# Patient Record
Sex: Male | Born: 1985 | Race: White | Hispanic: No | Marital: Single | Smoking: Former smoker
Health system: Southern US, Community
[De-identification: ages and names within clinical notes are randomized; demographics above are authoritative.]

## PROBLEM LIST (undated history)

## (undated) DIAGNOSIS — F32A Depression, unspecified: Secondary | ICD-10-CM

## (undated) DIAGNOSIS — F419 Anxiety disorder, unspecified: Secondary | ICD-10-CM

## (undated) DIAGNOSIS — K649 Unspecified hemorrhoids: Secondary | ICD-10-CM

## (undated) DIAGNOSIS — F329 Major depressive disorder, single episode, unspecified: Secondary | ICD-10-CM

## (undated) HISTORY — DX: Unspecified hemorrhoids: K64.9

## (undated) HISTORY — PX: ORTHOPEDIC SURGERY: SHX850

---

## 2009-02-06 ENCOUNTER — Emergency Department: Payer: Self-pay | Admitting: Emergency Medicine

## 2010-10-11 ENCOUNTER — Inpatient Hospital Stay: Payer: Self-pay | Admitting: Psychiatry

## 2012-02-02 ENCOUNTER — Inpatient Hospital Stay: Payer: Self-pay | Admitting: Psychiatry

## 2012-02-02 LAB — COMPREHENSIVE METABOLIC PANEL
Albumin: 4.9 g/dL (ref 3.4–5.0)
Alkaline Phosphatase: 112 U/L (ref 50–136)
Bilirubin,Total: 1.8 mg/dL — ABNORMAL HIGH (ref 0.2–1.0)
Calcium, Total: 9.5 mg/dL (ref 8.5–10.1)
Co2: 30 mmol/L (ref 21–32)
Creatinine: 1.03 mg/dL (ref 0.60–1.30)
EGFR (Non-African Amer.): >60
Glucose: 92 mg/dL (ref 65–99)
Osmolality: 268 (ref 275–301)
Potassium: 4.2 mmol/L (ref 3.5–5.1)
Sodium: 134 mmol/L — ABNORMAL LOW (ref 136–145)

## 2012-02-02 LAB — CBC
MCH: 31.1 pg (ref 26.0–34.0)
MCHC: 33.6 g/dL (ref 32.0–36.0)
Platelet: 274 10*3/uL (ref 150–440)
RBC: 4.97 10*6/uL (ref 4.40–5.90)
RDW: 13 % (ref 11.5–14.5)

## 2012-02-02 LAB — DRUG SCREEN, URINE
Amphetamines, Ur Screen: NEGATIVE (ref ?–1000)
Cannabinoid 50 Ng, Ur ~~LOC~~: NEGATIVE (ref ?–50)
MDMA (Ecstasy)Ur Screen: NEGATIVE (ref ?–500)
Methadone, Ur Screen: NEGATIVE (ref ?–300)
Opiate, Ur Screen: NEGATIVE (ref ?–300)
Phencyclidine (PCP) Ur S: NEGATIVE (ref ?–25)
Tricyclic, Ur Screen: NEGATIVE (ref ?–1000)

## 2012-02-02 LAB — TSH: Thyroid Stimulating Horm: 0.78 u[IU]/mL

## 2012-02-02 LAB — ETHANOL: Ethanol: <3 mg/dL

## 2014-04-28 ENCOUNTER — Emergency Department: Payer: Self-pay | Admitting: Emergency Medicine

## 2014-04-28 LAB — CBC
HCT: 44 % (ref 40.0–52.0)
HGB: 14.6 g/dL (ref 13.0–18.0)
MCH: 30.1 pg (ref 26.0–34.0)
MCHC: 33.2 g/dL (ref 32.0–36.0)
MCV: 91 fL (ref 80–100)
Platelet: 317 10*3/uL (ref 150–440)
RBC: 4.84 10*6/uL (ref 4.40–5.90)
RDW: 13 % (ref 11.5–14.5)
WBC: 9.2 10*3/uL (ref 3.8–10.6)

## 2014-04-28 LAB — ETHANOL: Ethanol: 213 mg/dL

## 2014-04-28 LAB — COMPREHENSIVE METABOLIC PANEL
ALBUMIN: 4.2 g/dL (ref 3.4–5.0)
ALK PHOS: 76 U/L
ANION GAP: 7 (ref 7–16)
AST: 32 U/L (ref 15–37)
BUN: 8 mg/dL (ref 7–18)
Bilirubin,Total: 0.8 mg/dL (ref 0.2–1.0)
CO2: 29 mmol/L (ref 21–32)
CREATININE: 0.83 mg/dL (ref 0.60–1.30)
Calcium, Total: 8.3 mg/dL — ABNORMAL LOW (ref 8.5–10.1)
Chloride: 107 mmol/L (ref 98–107)
EGFR (African American): >60
EGFR (Non-African Amer.): >60
GLUCOSE: 113 mg/dL — AB (ref 65–99)
OSMOLALITY: 284 (ref 275–301)
POTASSIUM: 3.8 mmol/L (ref 3.5–5.1)
SGPT (ALT): 39 U/L
SODIUM: 143 mmol/L (ref 136–145)
TOTAL PROTEIN: 7.5 g/dL (ref 6.4–8.2)

## 2014-04-28 LAB — SALICYLATE LEVEL: Salicylates, Serum: <1.7 mg/dL

## 2014-04-28 LAB — ACETAMINOPHEN LEVEL

## 2014-04-29 LAB — URINALYSIS, COMPLETE
BILIRUBIN, UR: NEGATIVE
BLOOD: NEGATIVE
Bacteria: NONE SEEN
Glucose,UR: NEGATIVE mg/dL (ref 0–75)
KETONE: NEGATIVE
LEUKOCYTE ESTERASE: NEGATIVE
Nitrite: NEGATIVE
PH: 7 (ref 4.5–8.0)
Protein: NEGATIVE
RBC,UR: NONE SEEN /HPF (ref 0–5)
SQUAMOUS EPITHELIAL: NONE SEEN
Specific Gravity: 1.002 (ref 1.003–1.030)
WBC UR: NONE SEEN /HPF (ref 0–5)

## 2014-04-29 LAB — DRUG SCREEN, URINE
AMPHETAMINES, UR SCREEN: NEGATIVE (ref ?–1000)
BARBITURATES, UR SCREEN: NEGATIVE (ref ?–200)
Benzodiazepine, Ur Scrn: NEGATIVE (ref ?–200)
Cannabinoid 50 Ng, Ur ~~LOC~~: NEGATIVE (ref ?–50)
Cocaine Metabolite,Ur ~~LOC~~: NEGATIVE (ref ?–300)
MDMA (ECSTASY) UR SCREEN: NEGATIVE (ref ?–500)
METHADONE, UR SCREEN: NEGATIVE (ref ?–300)
Opiate, Ur Screen: NEGATIVE (ref ?–300)
Phencyclidine (PCP) Ur S: NEGATIVE (ref ?–25)
TRICYCLIC, UR SCREEN: NEGATIVE (ref ?–1000)

## 2014-05-10 ENCOUNTER — Ambulatory Visit: Payer: Self-pay | Admitting: Orthopedic Surgery

## 2014-05-10 LAB — DRUG SCREEN, URINE
AMPHETAMINES, UR SCREEN: NEGATIVE (ref ?–1000)
Barbiturates, Ur Screen: NEGATIVE (ref ?–200)
Benzodiazepine, Ur Scrn: NEGATIVE (ref ?–200)
COCAINE METABOLITE, UR ~~LOC~~: NEGATIVE (ref ?–300)
Cannabinoid 50 Ng, Ur ~~LOC~~: NEGATIVE (ref ?–50)
MDMA (ECSTASY) UR SCREEN: NEGATIVE (ref ?–500)
Methadone, Ur Screen: NEGATIVE (ref ?–300)
Opiate, Ur Screen: NEGATIVE (ref ?–300)
PHENCYCLIDINE (PCP) UR S: NEGATIVE (ref ?–25)
Tricyclic, Ur Screen: NEGATIVE (ref ?–1000)

## 2014-07-04 ENCOUNTER — Emergency Department: Payer: Self-pay | Admitting: Emergency Medicine

## 2014-07-04 LAB — URINALYSIS, COMPLETE
BACTERIA: NONE SEEN
Bilirubin,UR: NEGATIVE
Glucose,UR: NEGATIVE mg/dL (ref 0–75)
Ketone: NEGATIVE
LEUKOCYTE ESTERASE: NEGATIVE
NITRITE: NEGATIVE
PROTEIN: NEGATIVE
Ph: 7 (ref 4.5–8.0)
SQUAMOUS EPITHELIAL: NONE SEEN
Specific Gravity: 1.002 (ref 1.003–1.030)
WBC UR: NONE SEEN /HPF (ref 0–5)

## 2014-07-04 LAB — CBC
HCT: 44.7 % (ref 40.0–52.0)
HGB: 14.5 g/dL (ref 13.0–18.0)
MCH: 30.1 pg (ref 26.0–34.0)
MCHC: 32.5 g/dL (ref 32.0–36.0)
MCV: 93 fL (ref 80–100)
PLATELETS: 307 10*3/uL (ref 150–440)
RBC: 4.82 10*6/uL (ref 4.40–5.90)
RDW: 13.4 % (ref 11.5–14.5)
WBC: 7.6 10*3/uL (ref 3.8–10.6)

## 2014-07-04 LAB — COMPREHENSIVE METABOLIC PANEL
ALBUMIN: 4.5 g/dL (ref 3.4–5.0)
ALT: 45 U/L
ANION GAP: 7 (ref 7–16)
Alkaline Phosphatase: 93 U/L
BUN: 8 mg/dL (ref 7–18)
Bilirubin,Total: 1 mg/dL (ref 0.2–1.0)
CALCIUM: 9 mg/dL (ref 8.5–10.1)
Chloride: 107 mmol/L (ref 98–107)
Co2: 28 mmol/L (ref 21–32)
Creatinine: 1.03 mg/dL (ref 0.60–1.30)
EGFR (Non-African Amer.): >60
Glucose: 104 mg/dL — ABNORMAL HIGH (ref 65–99)
Osmolality: 282 (ref 275–301)
POTASSIUM: 4 mmol/L (ref 3.5–5.1)
SGOT(AST): 28 U/L (ref 15–37)
SODIUM: 142 mmol/L (ref 136–145)
Total Protein: 7.8 g/dL (ref 6.4–8.2)

## 2014-07-04 LAB — DRUG SCREEN, URINE
AMPHETAMINES, UR SCREEN: NEGATIVE (ref ?–1000)
Barbiturates, Ur Screen: NEGATIVE (ref ?–200)
Benzodiazepine, Ur Scrn: NEGATIVE (ref ?–200)
CANNABINOID 50 NG, UR ~~LOC~~: NEGATIVE (ref ?–50)
COCAINE METABOLITE, UR ~~LOC~~: NEGATIVE (ref ?–300)
MDMA (Ecstasy)Ur Screen: NEGATIVE (ref ?–500)
Methadone, Ur Screen: NEGATIVE (ref ?–300)
Opiate, Ur Screen: POSITIVE (ref ?–300)
PHENCYCLIDINE (PCP) UR S: NEGATIVE (ref ?–25)
Tricyclic, Ur Screen: NEGATIVE (ref ?–1000)

## 2014-07-04 LAB — SALICYLATE LEVEL: Salicylates, Serum: <1.7 mg/dL

## 2014-07-04 LAB — ACETAMINOPHEN LEVEL

## 2014-07-04 LAB — ETHANOL: Ethanol: 211 mg/dL

## 2014-07-13 ENCOUNTER — Emergency Department (HOSPITAL_COMMUNITY): Payer: BC Managed Care – PPO

## 2014-07-13 ENCOUNTER — Emergency Department (HOSPITAL_COMMUNITY)
Admission: EM | Admit: 2014-07-13 | Discharge: 2014-07-14 | Disposition: A | Discharge status: Home / Self Care | Payer: BC Managed Care – PPO | Attending: Emergency Medicine | Admitting: Emergency Medicine

## 2014-07-13 ENCOUNTER — Encounter (HOSPITAL_COMMUNITY): Payer: Self-pay

## 2014-07-13 DIAGNOSIS — Z720 Tobacco use: Secondary | ICD-10-CM | POA: Insufficient documentation

## 2014-07-13 DIAGNOSIS — Y9389 Activity, other specified: Secondary | ICD-10-CM | POA: Diagnosis not present

## 2014-07-13 DIAGNOSIS — F1010 Alcohol abuse, uncomplicated: Secondary | ICD-10-CM | POA: Diagnosis present

## 2014-07-13 DIAGNOSIS — Z79899 Other long term (current) drug therapy: Secondary | ICD-10-CM | POA: Diagnosis not present

## 2014-07-13 DIAGNOSIS — F10220 Alcohol dependence with intoxication, uncomplicated: Secondary | ICD-10-CM

## 2014-07-13 DIAGNOSIS — F419 Anxiety disorder, unspecified: Secondary | ICD-10-CM | POA: Diagnosis not present

## 2014-07-13 DIAGNOSIS — Y998 Other external cause status: Secondary | ICD-10-CM | POA: Diagnosis not present

## 2014-07-13 DIAGNOSIS — F10229 Alcohol dependence with intoxication, unspecified: Secondary | ICD-10-CM | POA: Diagnosis not present

## 2014-07-13 DIAGNOSIS — F1110 Opioid abuse, uncomplicated: Secondary | ICD-10-CM | POA: Diagnosis not present

## 2014-07-13 DIAGNOSIS — S63501A Unspecified sprain of right wrist, initial encounter: Secondary | ICD-10-CM | POA: Diagnosis not present

## 2014-07-13 DIAGNOSIS — Y92410 Unspecified street and highway as the place of occurrence of the external cause: Secondary | ICD-10-CM | POA: Diagnosis not present

## 2014-07-13 DIAGNOSIS — Z880 Allergy status to penicillin: Secondary | ICD-10-CM | POA: Diagnosis not present

## 2014-07-13 DIAGNOSIS — R52 Pain, unspecified: Secondary | ICD-10-CM

## 2014-07-13 DIAGNOSIS — F329 Major depressive disorder, single episode, unspecified: Secondary | ICD-10-CM | POA: Insufficient documentation

## 2014-07-13 HISTORY — DX: Major depressive disorder, single episode, unspecified: F32.9

## 2014-07-13 HISTORY — DX: Depression, unspecified: F32.A

## 2014-07-13 HISTORY — DX: Anxiety disorder, unspecified: F41.9

## 2014-07-13 LAB — CBC WITH DIFFERENTIAL/PLATELET
Basophils Absolute: 0.1 10*3/uL (ref 0.0–0.1)
Basophils Relative: 1 % (ref 0–1)
EOS PCT: 0 % (ref 0–5)
Eosinophils Absolute: 0 10*3/uL (ref 0.0–0.7)
HCT: 46.2 % (ref 39.0–52.0)
Hemoglobin: 15.3 g/dL (ref 13.0–17.0)
LYMPHS ABS: 3.3 10*3/uL (ref 0.7–4.0)
Lymphocytes Relative: 36 % (ref 12–46)
MCH: 30.6 pg (ref 26.0–34.0)
MCHC: 33.1 g/dL (ref 30.0–36.0)
MCV: 92.4 fL (ref 78.0–100.0)
Monocytes Absolute: 0.8 10*3/uL (ref 0.1–1.0)
Monocytes Relative: 9 % (ref 3–12)
Neutro Abs: 5 10*3/uL (ref 1.7–7.7)
Neutrophils Relative %: 54 % (ref 43–77)
PLATELETS: 303 10*3/uL (ref 150–400)
RBC: 5 MIL/uL (ref 4.22–5.81)
RDW: 14 % (ref 11.5–15.5)
WBC: 9.3 10*3/uL (ref 4.0–10.5)
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
## 2014-07-14 ENCOUNTER — Encounter (HOSPITAL_COMMUNITY): Payer: Self-pay | Admitting: *Deleted

## 2014-07-14 ENCOUNTER — Emergency Department: Payer: Self-pay | Admitting: Emergency Medicine

## 2014-07-14 LAB — COMPREHENSIVE METABOLIC PANEL
Albumin: 4.1 g/dL (ref 3.4–5.0)
Alkaline Phosphatase: 104 U/L
Anion Gap: 7 (ref 7–16)
BUN: 8 mg/dL (ref 7–18)
Bilirubin,Total: 1.1 mg/dL — ABNORMAL HIGH (ref 0.2–1.0)
CHLORIDE: 101 mmol/L (ref 98–107)
CO2: 31 mmol/L (ref 21–32)
CREATININE: 0.95 mg/dL (ref 0.60–1.30)
Calcium, Total: 8.8 mg/dL (ref 8.5–10.1)
EGFR (African American): >60
EGFR (Non-African Amer.): >60
Glucose: 98 mg/dL (ref 65–99)
Osmolality: 276 (ref 275–301)
Potassium: 3.7 mmol/L (ref 3.5–5.1)
SGOT(AST): 29 U/L (ref 15–37)
SGPT (ALT): 38 U/L
SODIUM: 139 mmol/L (ref 136–145)
TOTAL PROTEIN: 7.4 g/dL (ref 6.4–8.2)

## 2014-07-14 LAB — DRUG SCREEN, URINE
Amphetamines, Ur Screen: NEGATIVE (ref ?–1000)
BARBITURATES, UR SCREEN: NEGATIVE (ref ?–200)
Benzodiazepine, Ur Scrn: POSITIVE (ref ?–200)
CANNABINOID 50 NG, UR ~~LOC~~: NEGATIVE (ref ?–50)
COCAINE METABOLITE, UR ~~LOC~~: NEGATIVE (ref ?–300)
MDMA (Ecstasy)Ur Screen: NEGATIVE (ref ?–500)
Methadone, Ur Screen: NEGATIVE (ref ?–300)
OPIATE, UR SCREEN: NEGATIVE (ref ?–300)
PHENCYCLIDINE (PCP) UR S: NEGATIVE (ref ?–25)
Tricyclic, Ur Screen: NEGATIVE (ref ?–1000)

## 2014-07-14 LAB — BASIC METABOLIC PANEL
Anion gap: 14 (ref 5–15)
BUN: 6 mg/dL (ref 6–23)
CALCIUM: 8.9 mg/dL (ref 8.4–10.5)
CO2: 29 mEq/L (ref 19–32)
Chloride: 104 mEq/L (ref 96–112)
Creatinine, Ser: 0.99 mg/dL (ref 0.50–1.35)
GFR calc Af Amer: >90 mL/min (ref >90)
Glucose, Bld: 114 mg/dL — ABNORMAL HIGH (ref 70–99)
Potassium: 4.4 mEq/L (ref 3.7–5.3)
Sodium: 147 mEq/L (ref 137–147)

## 2014-07-14 LAB — RAPID URINE DRUG SCREEN, HOSP PERFORMED
AMPHETAMINES: NOT DETECTED
BENZODIAZEPINES: NOT DETECTED
Barbiturates: NOT DETECTED
Cocaine: NOT DETECTED
OPIATES: NOT DETECTED
Tetrahydrocannabinol: NOT DETECTED

## 2014-07-14 LAB — URINALYSIS, COMPLETE
BILIRUBIN, UR: NEGATIVE
Bacteria: NONE SEEN
Blood: NEGATIVE
Glucose,UR: NEGATIVE mg/dL (ref 0–75)
Ketone: NEGATIVE
Leukocyte Esterase: NEGATIVE
Nitrite: NEGATIVE
Ph: 6 (ref 4.5–8.0)
Protein: 30
RBC,UR: 2 /HPF (ref 0–5)
Specific Gravity: 1.02 (ref 1.003–1.030)
Squamous Epithelial: <1
WBC UR: 1 /HPF (ref 0–5)

## 2014-07-14 LAB — CBC
HCT: 44.6 % (ref 40.0–52.0)
HGB: 14.5 g/dL (ref 13.0–18.0)
MCH: 30.3 pg (ref 26.0–34.0)
MCHC: 32.6 g/dL (ref 32.0–36.0)
MCV: 93 fL (ref 80–100)
Platelet: 283 10*3/uL (ref 150–440)
RBC: 4.8 10*6/uL (ref 4.40–5.90)
RDW: 14.4 % (ref 11.5–14.5)
WBC: 9.5 10*3/uL (ref 3.8–10.6)

## 2014-07-14 LAB — ETHANOL
ALCOHOL ETHYL (B): 249 mg/dL — AB (ref 0–11)
ALCOHOL ETHYL (B): 331 mg/dL — AB (ref 0–11)
ETHANOL LVL: 23 mg/dL

## 2014-07-14 LAB — ACETAMINOPHEN LEVEL: Acetaminophen: <2 ug/mL

## 2014-07-14 MED ORDER — METHOCARBAMOL 500 MG PO TABS: 500.0000 mg | ORAL_TABLET | Freq: Three times a day (TID) | ORAL | Status: DC | PRN

## 2014-07-14 MED ORDER — NAPROXEN 250 MG PO TABS: 500.0000 mg | ORAL_TABLET | Freq: Two times a day (BID) | ORAL | Status: DC | PRN

## 2014-07-14 MED ORDER — LORAZEPAM 1 MG PO TABS: 0.0000 mg | ORAL_TABLET | Freq: Two times a day (BID) | ORAL | Status: DC

## 2014-07-14 MED ORDER — VITAMIN B-1 100 MG PO TABS: 100.0000 mg | ORAL_TABLET | Freq: Every day | ORAL | Status: DC

## 2014-07-14 MED ORDER — THIAMINE HCL 100 MG/ML IJ SOLN: 100.0000 mg | Freq: Every day | INTRAMUSCULAR | Status: DC

## 2014-07-14 MED ORDER — HYDROXYZINE HCL 25 MG PO TABS
25.0000 mg | ORAL_TABLET | Freq: Four times a day (QID) | ORAL | Status: DC | PRN
  Administered 2014-07-14: 25 mg via ORAL
  Filled 2014-07-14: qty 1

## 2014-07-14 MED ORDER — DICYCLOMINE HCL 20 MG PO TABS
20.0000 mg | ORAL_TABLET | Freq: Four times a day (QID) | ORAL | Status: DC | PRN
  Filled 2014-07-14: qty 1

## 2014-07-14 MED ORDER — ONDANSETRON 4 MG PO TBDP
4.0000 mg | ORAL_TABLET | Freq: Four times a day (QID) | ORAL | Status: DC | PRN
  Administered 2014-07-14: 4 mg via ORAL
  Filled 2014-07-14: qty 1

## 2014-07-14 MED ORDER — CLONIDINE HCL 0.1 MG PO TABS
0.1000 mg | ORAL_TABLET | Freq: Four times a day (QID) | ORAL | Status: DC
  Administered 2014-07-14: 0.1 mg via ORAL
  Filled 2014-07-14: qty 1

## 2014-07-14 MED ORDER — LORAZEPAM 1 MG PO TABS: 0.0000 mg | ORAL_TABLET | Freq: Four times a day (QID) | ORAL | Status: DC

## 2014-07-14 MED ORDER — LOPERAMIDE HCL 2 MG PO CAPS: 2.0000 mg | ORAL_CAPSULE | ORAL | Status: DC | PRN

## 2014-07-14 MED ORDER — CLONIDINE HCL 0.1 MG PO TABS: 0.1000 mg | ORAL_TABLET | Freq: Every day | ORAL | Status: DC

## 2014-07-14 MED ORDER — CLONIDINE HCL 0.1 MG PO TABS: 0.1000 mg | ORAL_TABLET | ORAL | Status: DC
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
## 2014-07-14 NOTE — ED Notes (Signed)
Terri from behavorial health is conducting pt's tele pysch assessment.

## 2014-07-14 NOTE — ED Notes (Signed)
Pt is discharge with outpatient resources for substance abuse, pt refused splint for right wrist.  Pt was informed he could wait in waiting area until mother can come from [place], [place] to pick him up.

## 2014-07-14 NOTE — ED Notes (Signed)
Terri from behavorial health recommendation is for outpatient resources and discharge pt, pt will be given referrals once paperwork faxed from behavorial health, pt informed of discharge and that he will need to call someone for a ride.

## 2014-07-14 NOTE — ED Notes (Signed)
Spoke to pt's mother Render Marley, she said it would take her about an hour to pick up pt, and for him to wait in the lobby until they got here. Mother Kolyn Rozario [phone].

## 2014-11-19 NOTE — Consult Note (Signed)
PATIENT NAME:  Darryl Crawford, VANZANTEN MR#:  [id number] DATE OF BIRTH:  11-06-1985  DATE OF CONSULTATION:  07/15/2014  REFERRING PHYSICIAN:   CONSULTING PHYSICIAN:  Gonzella Lex, MD  IDENTIFYING INFORMATION AND REASON FOR CONSULTATION: A 29 year old man came voluntarily to the Emergency Room.   CHIEF COMPLAINT: "I'm detoxing from alcohol."   HISTORY OF PRESENT ILLNESS: Information obtained from the patient and the chart. The patient states that for about the last week he has been drinking nearly a fifth of liquor a day. He also was continuing to use narcotic pain medicine but it has been about 3 days since he had any. He was using Xanax to try and control some of his withdrawal symptoms in the meantime. On evaluation today, the patient says he is sore all over, muscles are aching, feels sick to his stomach, has had trouble being able to eat or drink anything this morning. He is not reporting any hallucinations. Does not appear to be delirious. He denies any suicidal or homicidal ideation. He had followed up at [facility] after his last visit here on December 8 but then relapsed into use again.   PAST PSYCHIATRIC HISTORY: Mr. Mozer has a long history of substance abuse. Has been to rehabilitation programs in the past. He has a history of often presenting agitated with unstable mood while he is intoxicated, but there is no evidence that he has had a mood or psychiatric disorder when he is not actively intoxicated. Not currently on any psychiatric medicine. No history of suicide attempts.   PAST MEDICAL HISTORY: No known medical problems. It was reported on the intake that he was described as a patient with a seizure disorder. He does not have any known history of a seizure disorder. He may have had an alcohol withdrawal seizure at one point, but not in the times we have been seeing him.   FAMILY HISTORY: Has a brother with bipolar disorder and alcohol abuse and depression in other members of the family.   SOCIAL  HISTORY: He has been living with his parents. He does have a job. Says he has been able to work some this past week. Has children, but has not been able to really do much to take care of them since he has been actively using.   CURRENT MEDICATIONS: None that I know of for sure.   ALLERGIES: No known drug allergies.   REVIEW OF SYSTEMS: Feeling sick to his stomach, has had loose bowel movements. Feels shaky and has pain all over. Denies suicidal or homicidal ideation. Denies any hallucinations. Mood is dysphoric, but not completely hopeless.   MENTAL STATUS EXAMINATION: Slightly disheveled gentleman who is cooperative with the interview. Eye contact intermittent. Psychomotor activity a little sluggish. Mood is quiet, but easy to understand. Affect is dysphoric and a little anxious. Mood is stated as depressed. Thoughts are lucid without loosening of associations or delusions. Denies auditory or visual hallucinations. Denies suicidal or homicidal ideation. He is alert and oriented x 4. Can repeat 3/3 objects immediately, remembers 2/3 at 3 minutes. Judgment and insight intact. Intelligence normal.   LABORATORY RESULTS: Drug screen positive only for benzodiazepines. Urinalysis unremarkable. Acetaminophen negative. CBC negative. Chemistry panel: Elevated bilirubin 1.1, nothing else remarkable.   VITAL SIGNS: Blood pressure 170/90, respirations 22, pulse 104, temperature 98.4.   ASSESSMENT: A 29 year old man with a history of alcohol dependence presented back to the hospital no longer intoxicated, but with alcohol withdrawal. Also having active symptoms of opiate withdrawal, even  though he has not used for a couple of days. The patient is calm right now and not delirious. Denies suicidal ideation. He is knowledgeable about appropriate outpatient treatment, and has established himself as a client at [facility]. He at this point does not require further inpatient hospitalization.   TREATMENT PLAN: The patient  requested that we give him a single dose of Suboxone prior to discharge for his ongoing opiate withdrawal. I think that is reasonable and we will give him 8 mg Subutex now before he leaves and a dose of Zofran as well. The patient will be going home to stay with his parents and will follow up with [facility] and we will see him immediately on Monday. If delirium develops or physically he is getting worse, he knows he can return to the Emergency Room.   DIAGNOSIS, PRINCIPAL AND PRIMARY:  AXIS I: Alcohol abuse, severe.   SECONDARY DIAGNOSES:  AXIS I:  1.  Opiate abuse.  2.  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  AXIS II: Deferred.  AXIS III: Opiate withdrawal.    ____________________________ Gonzella Lex, MD jtc:at D: 07/15/2014 14:10:01 ET T: 07/15/2014 14:41:00 ET JOB#: [id number]  cc: Gonzella Lex, MD, <Dictator> Gonzella Lex MD ELECTRONICALLY SIGNED 07/20/2014 0:40

## 2014-11-19 NOTE — Consult Note (Signed)
PATIENT NAME:  Darryl Crawford, Darryl Crawford MR#:  [id number] DATE OF BIRTH:  04-30-1986  DATE OF CONSULTATION:  04/29/2014  CONSULTING PHYSICIAN:  Gonzella Lex, MD  IDENTIFYING INFORMATION AND REASON FOR CONSULTATION: This is a 29 year old man who presented to the Emergency Room under involuntary commitment with report that he had been intoxicated, acting bizarre, talking about killing himself.   CHIEF COMPLAINT: "I relapsed."   HISTORY OF PRESENT ILLNESS: Commitment paperwork and report from the family as the patient was lying on the floor of his house at home with a knife to his neck talking about how he was going to kill himself with a gun, drink bleach, or stab himself in the neck. Family reported that he had been pacing, and agitated and talking about suicide. The patient says that he relapsed about 3 days ago and has been drinking a large amount of beer ever since then. Has not been using any other drugs. He has been feeling very bad about himself, because of his relapse, called his sponsor to try and get some help stopping, but was still not able to halt his relapse. He is not currently getting any outpatient psychiatric medicine. He says he has been under a lot of stress recently which may have been a cause for his relapse with a lot of work stress.   PAST PSYCHIATRIC HISTORY: History of alcohol dependence. Has been to [facility] in the past, has been able to maintain sobriety for almost a year at a time. Had been sober for probably about 8 months before this relapse 3 days ago. He denies that he has ever seriously tried to kill himself in the past. There does seem to be a history of hallucinations, especially when intoxicated and agitated, suicidal-like behavior when he is intoxicated.   The patient used to see a psychiatrist and was on antidepressant medicine but he said it made him feel too sedated so he stopped taking it about a year ago.   PAST MEDICAL HISTORY: He punched a post with his right hand a day  or so ago. Other than that has no significant ongoing medical problems.   SOCIAL HISTORY: Lives with his parents. Has 2 children who do not live with him full time, but he takes care of them part time in a shared custody. 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   SUBSTANCE ABUSE HISTORY: Long history of alcohol abuse. Has had alcohol withdrawal seizures, possibly DTs in the past. Does get hallucinatory and agitated when he is intoxicated.   FAMILY HISTORY: Mother with depression, brother with depression.   CURRENT MEDICATIONS: None.   ALLERGIES: No known drug allergies.   REVIEW OF SYSTEMS: Acute pain in his hand, tired, run down, mildly depressed. Denies suicidal ideation. Denies homicidal ideation. Denies current hallucinations. The rest of the full 9-category review of systems negative.   MENTAL STATUS EXAMINATION: Adequately groomed man who looks in reasonable health other than the obvious injury to his right hand. Cooperative with the exam. Eye contact decreased. Psychomotor activity slow. Speech slow and quiet. Affect flat. Mood stated as depressed. Thoughts are lucid but slow. No obvious loosening of associations or delusions. Denies auditory or visual hallucinations. Denies current suicidal or homicidal ideation. Could recall 3/3 objects immediately and at 3 minutes. Long-term memory intact. Judgment and insight currently adequate. Normal fund of knowledge. Normal intelligence.   LABORATORY RESULTS: Right hand x-ray shows a fifth metacarpal fracture, salicylates and acetaminophen negative.  Alcohol level is 213. Chemistry panel: Low calcium 8.3. CBC normal. Urinalysis normal. Drug screen all negative.   VITAL SIGNS: Blood pressure 127/74, respirations 18, pulse 96, temperature 98.1.   NEUROLOGIC: The patient walk, move all extremities, but his right hand and wrist are obviously swollen and tender.   ASSESSMENT: A 29 year old man with alcohol  dependence, substance-induced depression rule out major depression, fractured hand, needs hospitalization because of history of seizures and recent suicidality.   TREATMENT PLAN: Admit to psychiatry. CIWA protocol in place. Suicide, close and seizure precautions in place. I am going to get an orthopedic consult and discuss this with the Emergency Room doctor. We will get him some pain medicine and try and get his hand dealt with.   DIAGNOSIS, PRINCIPAL AND PRIMARY:   AXIS I: Substance-induced mood disorder, depressed, from alcohol.   SECONDARY DIAGNOSES:  AXIS I: Alcohol abuse.   AXIS II: Deferred.   AXIS III: Fracture of the right-hand, acute.    ____________________________ Gonzella Lex, MD jtc:lt D: 04/29/2014 15:44:14 ET T: 04/29/2014 15:53:30 ET JOB#: [id number]  cc: Gonzella Lex, MD, <Dictator> Gonzella Lex MD ELECTRONICALLY SIGNED 05/05/2014 16:08

## 2014-11-19 NOTE — Consult Note (Signed)
PATIENT NAME:  Darryl Crawford, RITSON MR#:  [id number] DATE OF BIRTH:  1986/05/19  DATE OF CONSULTATION:  07/04/2014  CONSULTING PHYSICIAN:  Gonzella Lex, MD  IDENTIFYING INFORMATION AND REASON FOR CONSULTATION: This is a 29 year old man with a history of alcohol abuse and mood instability who comes into the hospital requesting opiate detoxification.   CHIEF COMPLAINT: "I need detox."   HISTORY OF PRESENT ILLNESS: The patient states that since he was here last he had started up with abusing opiate pain medicine. It may have started because of his broken hand. In any case, he said he first started with taking Vicodin, but then escalated up to any kind of opiate he could get. He denies that he has been injecting drugs, but has been inhaling heroin and other drugs. He also states that he has been back to drinking, but he minimizes the degree of that. He says that he has not had anything to drink for a couple of days and that he feels like he has gotten over all of his alcohol withdrawal symptoms and his main concern now is opiate withdrawal symptoms. He complains of pain all over his body, shaking, feeling sick to his stomach. He denies any suicidal ideation or homicidal ideation or psychotic symptoms.   PAST PSYCHIATRIC HISTORY: The patient has a history of alcohol abuse with mood instability, agitation, and suicidal behavior, and hostility while he is intoxicated. He was here in early October and we tried to admit him to the hospital, but there was a hold up because of his broken arm. He does have a history of going to [facility] and has been able to stay sober for extended times. The opiate abuse he says is a new problem for him.   PAST MEDICAL HISTORY: Back in October he had punched a wall and broke his hand. That seems to have healed up now.   FAMILY HISTORY: Has a brother with bipolar disorder and alcoholism. Also history of depression in other members of his family.   CURRENT MEDICATIONS: None.   SOCIAL  HISTORY: Currently has been back living with his parents. He had been able to work intermittently recently.   REVIEW OF SYSTEMS: Nausea, diarrhea, pain all over his body, feeling fatigued, and sick. Some hopelessness, but denies suicidal ideation. Denies hallucinations.   MENTAL STATUS EXAMINATION: A somewhat disheveled gentleman who looks his stated age. Passively cooperative with the interview. Eye contact decreased. Psychomotor activity sluggish. Speech decreased in total amount and quiet. Affect flat. Mood stated as bad. Thoughts are slow but lucid. No evidence of delusions or loosening of associations. Denies auditory or visual hallucinations. Denies suicidal or homicidal ideation. Can remember 3/3 objects immediately, 2 out of 3 at 3 minutes. Alert and oriented x 4. Judgment and insight intact.   LABORATORY RESULTS: Alcohol level at 2:00 today was 200, which is puzzling given that he says that he had stopped drinking a few days ago. Chemistry panel unremarkable. CBC normal. Urinalysis positive for 1+ blood, otherwise negative. Drug screen positive for opiates.   VITAL SIGNS: Blood pressure is currently 144/99, respirations 20, pulse 120, temperature 98.   ASSESSMENT: A 29 year old man with a history of alcohol abuse and a history of depressed mood, presents now with his chief complaint being opiate abuse. Although he was intoxicated with a blood alcohol level of 200 when he came in here, he was not acting agitated or violent. He says that he actually stopped drinking a few days ago and feels like  he is past the worst of the alcohol withdrawal symptoms. He is not acutely suicidal. There is no clear indication for inpatient hospital treatment. The patient is clearly miserable from the opiate withdrawal.   TREATMENT PLAN: Stat order of Subutex 8 mg followed by another day and a half of Subutex 4 mg twice a day. Ondansetron p.r.n. for nausea. The patient is under involuntary commitment. We can look  into trying to refer him to [facility] as he does not require hospital level treatment necessarily.   DIAGNOSIS, PRINCIPAL AND PRIMARY:  AXIS I: Opiate abuse, moderate.   SECONDARY DIAGNOSES: AXIS I:  1.  Alcohol abuse, moderate.  2.  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   ____________________________ Gonzella Lex, MD jtc:LT D: 07/04/2014 17:06:06 ET T: 07/04/2014 18:21:34 ET JOB#: [id number]  cc: Gonzella Lex, MD, <Dictator> Gonzella Lex MD ELECTRONICALLY SIGNED 07/10/2014 11:15

## 2014-11-19 NOTE — H&P (Signed)
PATIENT NAME:  Darryl Crawford, Darryl Crawford MR#:  [id number] DATE OF BIRTH:  12/31/1985  DATE OF ADMISSION:  04/28/2014  REFERRING PHYSICIAN: Marjean Donna, MD  ATTENDING  PHYSICIAN:  Jolanta B. Bary Leriche, MD   IDENTIFYING DATA: Darryl Crawford is a 29 year old male with history of depression and alcoholism.   CHIEF COMPLAINT: "I was drunk."   HISTORY OF PRESENT ILLNESS: [name] has been drinking since the age of 53. In the past few months, however, maybe half a year, he has been sober. He has been staying with his parents. He takes care of his 51-1/2-year-old son. He goes to [employer], has a sponsor. 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 He was about to move out of his parents' house and lives independently with his son. Three days ago, he relapsed on alcohol. He is unable to find any reason except that maybe he was trying to torpedo his plan to move out. As a result, he became very depressed, upset and suicidal. Yesterday, he punched a post and fractured his hand. Later, he became agitated, pacing and not sleeping. He threatened to kill himself with a gun, had a knife beforehand and then was found on the floor by his mother with a knife. When the police arrived, he was still threatening to kill himself. He was brought to the emergency room. He feels very depressed with poor self-esteem, feelings of guilt, hopelessness, worthlessness, crying, social isolation and still passing suicidal thoughts. He now is upset not only about his relapse, but also about the fact that while drunk, he can be so unpredictable, dangerous to himself, frightening to his family and his son. He denies psychotic symptoms. There are no symptoms suggestive of bipolar mania. He denies, other than alcohol, substance use.   PAST PSYCHIATRIC HISTORY: He has been a drinker for a long time. He was hospitalized at [facility] at least twice in 2013 and 2012 in a similar scenario. In  the past, he has been treated for depression, but felt that antidepressants made him sleepy and unable to work. He has not been seeing a psychiatrist lately. He denies ever attempting suicide. He has a history of alcohol where there were seizures, but tells me that he has not had seizures for 6 years and no longer takes Dilantin.   FAMILY PSYCHIATRIC HISTORY: Brother with bipolar and alcoholism and depression his mother's side.   PAST MEDICAL HISTORY: Fracture of the right hand, comminuted fracture involving the base of the fifth metacarpal. He needs a cast.   ALLERGIES: No known drug allergies.   MEDICATIONS ON ADMISSION: Now none.   SOCIAL HISTORY: As above. He lives with his parents. He takes care of his 86-1/2-year-old son. He has never been married to the mother. He has a job on a farm, earns income. He does not have health insurance. He denies any legal charges.   REVIEW OF SYSTEMS:  CONSTITUTIONAL: No fevers or chills. No weight changes.  EYES: No double or blurred vision.  ENT: No hearing loss.  RESPIRATORY: No shortness of breath or cough.  CARDIOVASCULAR: No chest pain or orthopnea.  GASTROINTESTINAL: No abdominal pain, nausea, vomiting or diarrhea.  GENITOURINARY: No incontinence or frequency.  ENDOCRINE: No heat or cold intolerance.  LYMPHATIC: No anemia or easy bruising.  INTEGUMENTARY: No acne or rash.  MUSCULOSKELETAL: Fracture of right hand.  NEUROLOGIC: No tingling or weakness.  PSYCHIATRIC: See History of Present Illness for details.  PHYSICAL EXAMINATION: VITAL SIGNS: Blood pressure 127/74, pulse 96, respirations 18, temperature 98.1.  GENERAL: This is a well-developed, young male in no acute distress.  HEENT: The pupils are equal, round and reactive to light. Sclerae are anicteric.  NECK: Supple. No thyromegaly.  LUNGS: Clear to auscultation. No dullness to percussion.  HEART: Regular rhythm and rate. No murmurs, rubs or gallops.  ABDOMEN: Soft, nontender,  nondistended. Positive bowel sounds.  MUSCULOSKELETAL: Normal muscle strength in all extremities. Swelling of the right hand. There is no cast yet.  SKIN: No rashes or bruises.  LYMPHATIC: No cervical adenopathy.  NEUROLOGIC: Cranial nerves 2 through 12 are intact.   LABORATORY DATA: Chemistries are within normal limits. Blood alcohol level is 213. LFTs within normal limits. Urine toxicology screen negative for substances. CBC within normal limits. Urinalysis is not suggestive of urinary tract infection. Serum acetaminophen and salicylates are low.   MENTAL STATUS EXAMINATION ON ADMISSION: The patient is alert and oriented to person, place, time and situation. He is pleasant, polite and cooperative. He maintains good eye contact. He is well groomed, wearing hospital scrubs. His speech is of normal rhythm, rate and volume. Mood is ashamed with a flat affect. Thought process is logical and goal oriented. Thought content: He denies thoughts of hurting himself or others, but threatened suicide while drunk earlier. There are no delusions or paranoia. There are no auditory or visual hallucinations. His cognition is grossly intact. Registration, recall, short and long-term memory are intact. He is of average intelligence and fund of knowledge. His insight and judgment are fair.   SUICIDE RISK ASSESSMENT ON ADMISSION: This is a patient with a history of alcoholism, who relapsed on alcohol 3 days ago following an extended period of sobriety, became rather depressed, upset and suicidal about it.  INITIAL DIAGNOSES:   AXIS I:  1. Substance-induced depression.  2. Alcohol use disorder.   AXIS II: Deferred.   AXIS III: Fracture of the right hand.   PLAN: The patient was admitted to [facility] for safety, stabilization and medication management. He was initially placed on suicide precautions and was closely monitored for any unsafe behaviors. He underwent full  psychiatric and risk assessment. He received pharmacotherapy, individual and group psychotherapy, substance abuse counseling and support from therapeutic milieu.  1. Suicidal ideation: The patient is able to contract for safety.  2. Mood. He is not interested in starting an antidepressant. He has some bad experience from the past.  3. Alcohol. He relapsed 3 days ago. He was placed on a CIWA protocol.  4. Fracture. I was told that the patient will have a splint placed in the emergency room before coming to the unit. Dr. Weber Cooks started him on Percocet for pain.  5. Substance abuse treatment. He does not feel that he needs to go to rehab. He hopes to return home and to work as soon as possible.   DISPOSITION: Discharged to home.   FOLLOWUP: With probably [facility].     ____________________________ Wardell Honour Bary Leriche, MD jbp:TT D: 04/29/2014 18:21:27 ET T: 04/29/2014 19:09:52 ET JOB#: [id number]  cc: Jolanta B. Bary Leriche, MD, <Dictator> Clovis Fredrickson MD ELECTRONICALLY SIGNED 05/05/2014 0:54

## 2014-11-19 NOTE — Op Note (Signed)
PATIENT NAME:  Darryl Crawford, Darryl Crawford MR#:  [id number] DATE OF BIRTH:  1985-08-27  DATE OF PROCEDURE:  05/10/2014  PREOPERATIVE DIAGNOSIS: Right fifth proximal metacarpal fracture, displaced intra-articular.   POSTOPERATIVE DIAGNOSIS: Right fifth proximal metacarpal fracture, displaced intra-articular.   PROCEDURE: Closed reduction and percutaneous pinning right fifth metacarpal.   ANESTHESIA: General.   SURGEON: Hessie Knows, MD.   DESCRIPTION OF PROCEDURE: The patient was brought to the operating room and after adequate general anesthesia was obtained, appropriate patient identification and timeout procedure were completed, after prepping and draping the arm. With longitudinal traction applied and dorsal pressure to the dorsal aspect of the proximal fifth metacarpal essentially anatomic alignment could be obtained.  Two 0.45 K-wires were then inserted from the ulnar side into the fourth metacarpal and carpus, parallel pins being placed. With release of traction the reduction was maintained. Rotation was correct with good position of the metacarpal in both AP and lateral projections of the mini C-arm. The pins were cut short and bent over. Xeroform was used to dress the base of the pins, followed by 4 x 4s and a volar splint. The patient was then sent to the recovery room in stable condition.   ESTIMATED BLOOD LOSS: Minimal.   COMPLICATIONS: None.   SPECIMEN: None.   IMPLANTS: 0.45 K wire x 2.     ____________________________ Laurene Footman, MD mjm:bu D: 05/10/2014 19:12:28 ET T: 05/10/2014 20:26:39 ET JOB#: [id number]  cc: Laurene Footman, MD, <Dictator> Laurene Footman MD ELECTRONICALLY SIGNED 05/11/2014 0:29

## 2014-11-19 NOTE — Consult Note (Signed)
Psychiatry: Reevaluate after 2+ days in the ER. Patient was not admitted to unit despite my orders on the grounds that he had a cast on his broken arm. Today he is lucid and calm though still anxious. No sign on delirium. Denies any suicidal or homicidal ideation. Plan is now for discharge as he is past worst risk of illness and is not deliroud. He has a clear plan to seek rehab treatment.  time order for 50mg  librium prior to discharge. Release from [facility]. Discharge home to follow up with [facility].  Electronic Signatures: Gonzella Lex (MD)  (Signed on 05-Oct-15 12:25)  Authored  Last Updated: 05-Oct-15 12:25 by Gonzella Lex (MD)

## 2014-11-19 NOTE — Consult Note (Signed)
PATIENT NAME:  Darryl Crawford, Darryl Crawford MR#:  [id number] DATE OF BIRTH:  1986-01-15  DATE OF CONSULTATION:  07/05/2014  REFERRING PHYSICIAN:   CONSULTING PHYSICIAN:  Gonzella Lex, MD  FOLLOWUP CONSULT NOTE    HISTORY OF PRESENT ILLNESS: After evaluation yesterday, the patient was given some Suboxone and has rested overnight. On re-evaluation today, he states that he is physically feeling a little bit better. He is not feeling as sore or jittery, and has not felt sick to his stomach this morning. His mood is still mildly dysphoric, but he denies any suicidal or homicidal ideation. The patient is not having any psychotic symptoms. He is requesting that he be released from the hospital, and stating that he does not want to go to the alcohol and drug abuse treatment center. He is able to articulate a desire to go to outpatient treatment appropriately.   REVIEW OF SYSTEMS: Denies nausea, denies severe pain. Mild aches and pains still present. Denies suicidal or homicidal ideation. Mildly depressed, but not hopeless or overwhelmingly negative. No psychotic symptoms.   MENTAL STATUS EXAMINATION: Neatly groomed gentleman who looks his stated age, cooperative with the interview. Good eye contact. Normal psychomotor activity and normal speech. Affect is still slightly blunted. Mood stated as being okay. Thoughts are lucid. No evidence of loosening of associations or delusions. Denies auditory or visual hallucinations. Denies suicidal or homicidal ideation. Alert and oriented x 4. Judgment and insight intact. Short and long-term memory intact.   ASSESSMENT: The patient does not currently meet commitment criteria and is requesting that he be allowed to go home. He has met with representatives from [facility] support team and is agreeable to going to the intensive outpatient treatment there. He also states a plan to go to his Alcoholics Anonymous meetings. His family is going to hold onto his money. The patient is  able to articulate positive reasons to stay off of drugs and alcohol. At this time, I can take him off commitment, and he will be released with a plan that he will follow up with intensive outpatient with [facility]. I gave him another 4 mg of Subutex today before he leaves, to hold him over with the opiate withdrawal.   DIAGNOSIS, PRINCIPAL AND PRIMARY:  AXIS I: Opiate abuse, severe.   SECONDARY DIAGNOSES:  AXIS I: Alcohol abuse, severe. 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   ____________________________ Gonzella Lex, MD jtc:JT D: 07/05/2014 11:29:46 ET T: 07/05/2014 11:37:56 ET JOB#: [id number]  cc: Gonzella Lex, MD, <Dictator> Gonzella Lex MD ELECTRONICALLY SIGNED 07/10/2014 11:15

## 2014-11-20 NOTE — H&P (Signed)
PATIENT NAME:  Darryl Crawford, BOOMER MR#:  [id number] DATE OF BIRTH:  May 03, 1986  DATE OF ADMISSION:  02/02/2012  REFERRING PHYSICIAN: Lenise Arena, MD   ATTENDING PHYSICIAN: Tristin Vandeusen B. Bary Leriche, MD    IDENTIFYING DATA: Darryl Crawford is a 29 year old male with history of alcoholism.   CHIEF COMPLAINT: "I relapsed".  HISTORY OF PRESENT ILLNESS: [name] was hospitalized at [facility] in March of 2012 in a similar scenario. Following discharge he reports a long period of sobriety. However, a few weeks ago he relapsed on alcohol and started drinking up to 18 beers a day. Four days ago he stopped. He came to the hospital after an episode of seizures and required alcohol detox. He reports no symptoms of anxiety, mood problems, or psychosis. He thinks that drinking is his major problem. Following last discharge he did not follow-up with substance abuse counseling and did not take any medications. He denies other than alcohol substance use or prescription pill abuse. He does admit eventually to experimenting some with cocaine.   FAMILY PSYCHIATRIC HISTORY: No known history. No completed suicide in the family.   PAST MEDICAL HISTORY: None.   ALLERGIES: No known drug allergies.   MEDICATIONS ON ADMISSION: None.   SOCIAL HISTORY: He is originally from [place], graduated from high school. No college. He has been working in Building control surveyor. He has been working for two years but currently the work is slow. With his drinking he does not work much. He has a 74-year-old boy, does not pay child support. Not married to the mother. He has been drinking since the age of 56. No DUI's. No legal charges pending.  REVIEW OF SYSTEMS: CONSTITUTIONAL: No fevers or chills. No weight changes. EYES: No double or blurred vision. ENT: No hearing loss. RESPIRATORY: No shortness of breath. CARDIOVASCULAR: No chest pain or orthopnea. GASTROINTESTINAL: No abdominal pain, nausea, vomiting, or  diarrhea. GU: No incontinence or frequency. ENDOCRINE: No heat or cold intolerance. LYMPHATIC: No anemia or easy bruising. INTEGUMENTARY: No acne or rash. MUSCULOSKELETAL: No muscle or joint pain. NEUROLOGIC: No tingling or weakness. PSYCHIATRIC: See history of present illness for details.   PHYSICAL EXAMINATION:   VITAL SIGNS: Blood pressure 121/102, pulse 101, respirations 20, temperature 98.2.   GENERAL: This is a slender male shaky and sweaty.   HEENT: The pupils are equal, round, and reactive to light. Sclerae anicteric.   NECK: Supple. No thyromegaly.   LUNGS: Clear to auscultation. No dullness to percussion.   HEART: Regular rhythm and rate. No murmurs, rubs, or gallops.   ABDOMEN: Soft, nontender, nondistended. Positive bowel sounds.   MUSCULOSKELETAL: Normal muscle strength in all extremities.   SKIN: No rashes or bruises.   LYMPHATIC: No cervical adenopathy.   NEUROLOGIC: Cranial nerves II through XII are intact.   LABORATORY DATA: Chemistries are within normal limits except for sodium of 134. Blood alcohol level 0. LFTs within normal limits except for total bilirubin of 1.8. TSH 0.78. Serum Dilantin 4.1. Urine tox screen negative for substances. CBC within normal limits.   MENTAL STATUS EXAMINATION ON ADMISSION: The patient is asleep but easily arousable. He is pleasant, polite, and cooperative. He is marginally groomed. There is limited eye contact. His speech is soft and there is poverty of speech. Mood is depressed with anxious affect. Thought processing is logical and goal oriented with poverty of thought. He denies suicidal or homicidal ideation. There are no delusions or paranoia. There are no auditory or visual hallucinations. His cognition is  grossly intact but difficult to assess. His insight and judgment are questionable.   SUICIDE RISK ASSESSMENT ON ADMISSION: This is a patient with history of alcoholism but no mood symptoms.   DIAGNOSIS:  AXIS I: Alcohol  dependence.   AXIS II: Deferred.   AXIS III: Alcohol withdrawal seizures.   AXIS IV: Substance abuse, employment, access to care, primary support.   AXIS V: GAF on admission 25.   PLAN: The patient was admitted to [facility] for safety, stabilization, and medication management. He was initially placed on suicide precautions and was closely monitored for any unsafe behaviors. He underwent full psychiatric and risk assessment. He received pharmacotherapy, individual and group psychotherapy, substance abuse counseling, and support from therapeutic milieu.  1. Alcohol detox. The patient was placed on a standard CIWA protocol. He will be monitored for symptoms of alcohol withdrawal.  2. Alcohol withdrawal seizures. He was loaded with Dilantin in the Emergency Room. Will continue that.  3. Mood. The patient denies symptoms of depression. He is not interested in taking medication following discharge. Will continue to negotiate.  4. Disposition. He will be discharged to home. He is not at the moment interested in substance abuse rehab but it may change.   ____________________________ Wardell Honour. Bary Leriche, MD [id number] D: 02/03/2012 18:54:10 ET T: 02/04/2012 06:25:21 ET JOB#: [id number]  cc: Jessi Pitstick B. Bary Leriche, MD, <Dictator>  Clovis Fredrickson MD ELECTRONICALLY SIGNED 02/14/2012 6:06

## 2014-11-20 NOTE — H&P (Signed)
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 Patient is never married and currently he lives with his parents who are 25 years old and 44 years old along with brother who is 58 years old. All four of them live in a house. Patient comes for readmission to [facility] with a chief complaint "I have been drinking too much. I decided I cannot drink like this anymore. I have been drinking at this rate for the past four days, been drinking 18 beers per day and when I come out of it I get seizures. I told my mother who drove me here for admission and help."  HISTORY OF PRESENT ILLNESS: Patient was last discharged from [facility] on 10/16/2010 after being detoxed and reports that he had been sober since then by himself. He was recommended to go to community substance abuse and mental health treatment but he did not. He was able to stay sober without any problems. However, he has been feeling down and low and depressed and started drinking at the rate of 18 beers per day and he realized that this is too much and he needed help and so he came for help.   PAST PSYCHIATRIC HISTORY: This is his second inpatient hospitalization in psychiatry. First inpatient hospitalization in psychiatry was in March 2012 for a similar episode of drinking heavily. No history of suicide attempts. Not being followed by any psychiatrist and is not on any psychotropic medications and did not keep up any follow-up appointments for substance abuse in the community.   FAMILY HISTORY OF MENTAL ILLNESS: No known family history of mental illness. No history of suicides in the family.  FAMILY HISTORY: Raised by parents. Father works in a Market researcher. Mother is on disability for degenerative osteoarthritis.   PERSONAL HISTORY: Born in [place], [place]. Graduated from high school. No college.   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 He has been employed for two years and currently work is slow and so he is unemployed.   MILITARY HISTORY: None.   MARRIAGES: Never married. Has a 40-years-old boy from a relationship. Does not pay child support. Gets to see his son as much as he can.   ALCOHOL AND DRUGS: First drink of alcohol age 69 years. Became a problem by the time he was 29 years old. Used to drink in binges. He had been sober since March 2012 after detox at [facility]. Started drinking again four days ago because of feeling lonely and not being employed and has nothing to do. Does admit to seizures while withdrawing from alcohol. Denies any DTs. Does admit to tremors and blackouts. No history of DWIs. Never arrested for public drunkenness. Denies street or prescription drug abuse. Denies using IV drugs. Does admit smoking nicotine cigarettes; a half pack lasts for two days.   PAST MEDICAL HISTORY: No known history of high blood pressure. No known diabetes mellitus. No major surgeries. No major injuries. No history of motor vehicle accident. Never been unconscious.   ALLERGIES: No known drug allergies.   PRIMARY CARE PHYSICIAN: Not being followed by any physician and goes to Emergency Room as needed.   MENTAL  STATUS EXAM: Patient is dressed in street clothes, alert and oriented to place, person and time. Eye contact is poor and looking down throughout the interview. Psychomotor slowness is noted. Speech is quiet and mumbled and slow. Affect is flat and not much expression. Denies feeling depressed. Admits that he is anxious and worried about his alcohol drinking. Thought processes are logical and goal directed. No evidence of psychosis. Denies auditory or visual  hallucinations. Denies hearing voices, seeing things. Denies paranoid or suspicious ideas. Denies any ideas or plans to hurt himself or others. He could spell the word world forward and backward without much problems. Abstract interpretation is fair for his level of education. Could count money. 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 Does admit to sleep and appetite disturbance because he has been drinking all the time. Insight and judgment guarded.     PHYSICAL EXAMINATION:  VITAL SIGNS: Temperature 96.4, pulse 90 per minute regular, respirations 20 per minute regular, blood pressure 140/90 mmHg.   HEENT: Head is normocephalic, atraumatic. Pupils are equal, round, and reactive to light and accommodation. Fundi bilaterally benign. Extraocular movements visualized. Tympanic membrane visualized, no exudates.  NECK: Supple without any thyromegaly, organomegaly, lymphadenopathy.   CHEST: Normal expansion. Normal breath sounds heard.   HEART: Normal S1 without any murmurs or gallops.   LUNGS: Clear to auscultation and percussion.   ABDOMEN: Soft. No organomegaly. Bowel sounds heard.   RECTAL: Deferred.  NEUROLOGICAL: Gait is normal. Romberg is negative. Cranial nerves II through XII grossly intact and normal. DTRs 2+ and normal. Plantars are normal response.   IMPRESSION: AXIS I:  1. Alcohol dependence, chronic, continuous.  2. Nicotine abuse/dependence.  3. Substance induced mood disorder.   AXIS II: Deferred.  AXIS III: Alcohol without seizures by history.   AXIS IV: Severe-Occupational and being unemployed and financial, living with parents and drinking alcohol in binges.  AXIS V: Global Assessment of Functioning 30.  PLAN: Patient is admitted to [facility] for close observation, evaluation and help. He will be started on alcohol detox Ativan. During the stay in the hospital he will be closely observed and medications will be  adjusted as needed. He will be given milieu therapy and supportive counseling where substance abuse with focus to alcohol drinking and problems related to the same will be addressed. At the time of discharge his detox will be completed and Social Services will look into appropriate program in the community which will help patient stay sober.   ____________________________ Wallace Cullens. Franchot Mimes, MD skc:cms D: 02/02/2012 19:32:43 ET T: 02/03/2012 07:45:29 ET JOB#: [id number]  cc: Arlyn Leak K. Franchot Mimes, MD, <Dictator> Dewain Penning MD ELECTRONICALLY SIGNED 02/15/2012 22:56
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
## 2015-05-29 ENCOUNTER — Emergency Department (HOSPITAL_COMMUNITY)
Admission: EM | Admit: 2015-05-29 | Discharge: 2015-05-30 | Disposition: A | Discharge status: Home / Self Care | Payer: Self-pay | Attending: Emergency Medicine | Admitting: Emergency Medicine

## 2015-05-29 ENCOUNTER — Encounter (HOSPITAL_COMMUNITY): Payer: Self-pay | Admitting: Emergency Medicine

## 2015-05-29 DIAGNOSIS — Z880 Allergy status to penicillin: Secondary | ICD-10-CM | POA: Insufficient documentation

## 2015-05-29 DIAGNOSIS — Z720 Tobacco use: Secondary | ICD-10-CM | POA: Insufficient documentation

## 2015-05-29 DIAGNOSIS — F329 Major depressive disorder, single episode, unspecified: Secondary | ICD-10-CM | POA: Insufficient documentation

## 2015-05-29 DIAGNOSIS — F419 Anxiety disorder, unspecified: Secondary | ICD-10-CM | POA: Insufficient documentation

## 2015-05-29 DIAGNOSIS — Y9389 Activity, other specified: Secondary | ICD-10-CM | POA: Insufficient documentation

## 2015-05-29 DIAGNOSIS — X58XXXA Exposure to other specified factors, initial encounter: Secondary | ICD-10-CM | POA: Insufficient documentation

## 2015-05-29 DIAGNOSIS — Y9289 Other specified places as the place of occurrence of the external cause: Secondary | ICD-10-CM | POA: Insufficient documentation

## 2015-05-29 DIAGNOSIS — T7840XA Allergy, unspecified, initial encounter: Secondary | ICD-10-CM | POA: Insufficient documentation

## 2015-05-29 DIAGNOSIS — Y998 Other external cause status: Secondary | ICD-10-CM | POA: Insufficient documentation

## 2015-05-29 MED ORDER — EPINEPHRINE 0.3 MG/0.3ML IJ SOAJ
0.3000 mg | Freq: Once | INTRAMUSCULAR | Status: AC
  Administered 2015-05-29: 0.3 mg via INTRAMUSCULAR
  Filled 2015-05-29: qty 0.3

## 2015-05-29 MED ORDER — SODIUM CHLORIDE 0.9 % IV BOLUS (SEPSIS)
1000.0000 mL | Freq: Once | INTRAVENOUS | Status: AC
  Administered 2015-05-29: 1000 mL via INTRAVENOUS

## 2015-05-29 MED ORDER — FAMOTIDINE IN NACL 20-0.9 MG/50ML-% IV SOLN
20.0000 mg | Freq: Once | INTRAVENOUS | Status: AC
  Administered 2015-05-29: 20 mg via INTRAVENOUS
  Filled 2015-05-29: qty 50

## 2015-05-29 MED ORDER — PREDNISONE 10 MG PO TABS: 20.0000 mg | ORAL_TABLET | Freq: Every day | ORAL | Status: DC

## 2015-05-29 MED ORDER — DIPHENHYDRAMINE HCL 50 MG/ML IJ SOLN
50.0000 mg | Freq: Once | INTRAMUSCULAR | Status: AC
  Administered 2015-05-29: 50 mg via INTRAVENOUS
  Filled 2015-05-29: qty 1

## 2015-05-29 MED ORDER — FAMOTIDINE 20 MG PO TABS: 20.0000 mg | ORAL_TABLET | Freq: Two times a day (BID) | ORAL | Status: DC

## 2015-05-29 MED ORDER — METHYLPREDNISOLONE SODIUM SUCC 125 MG IJ SOLR
125.0000 mg | Freq: Once | INTRAMUSCULAR | Status: AC
Start: 2015-05-29 — End: 2015-05-29
  Administered 2015-05-29: 125 mg via INTRAVENOUS
  Filled 2015-05-29: qty 2

## 2015-05-29 NOTE — ED Notes (Signed)
Pt states he went outside to smoke and started feeling like his throat was closing up and started itching all over  Pt has rash noted  Pt states he is having swelling to his feet and pt is coughing

## 2015-05-29 NOTE — ED Provider Notes (Signed)
CSN: [id number]     Arrival date & time 05/29/15  2146 History   First MD Initiated Contact with Patient 05/29/15 2159     Chief Complaint  Patient presents with  . Allergic Reaction     (Consider location/radiation/quality/duration/timing/severity/associated sxs/prior Treatment) Patient is a 29 y.o. male presenting with allergic reaction. The history is provided by the patient (The patient started with a rash and itching this evening with swelling to his legs.).  Allergic Reaction Presenting symptoms: rash   Severity:  Moderate Prior allergic episodes:  No prior episodes Context: no animal exposure   Relieved by:  Epinephrine Worsened by:  Nothing tried   Past Medical History  Diagnosis Date  . Depression   . Anxiety    Past Surgical History  Procedure Laterality Date  . Orthopedic surgery     History reviewed. No pertinent family history. Social History  Substance Use Topics  . Smoking status: Current Every Day Smoker  . Smokeless tobacco: None  . Alcohol Use: No    Review of Systems  Constitutional: Negative for appetite change and fatigue.  HENT: Negative for congestion, ear discharge and sinus pressure.   Eyes: Negative for discharge.  Respiratory: Negative for cough.   Cardiovascular: Negative for chest pain.  Gastrointestinal: Negative for abdominal pain and diarrhea.  Genitourinary: Negative for frequency and hematuria.  Musculoskeletal: Negative for back pain.  Skin: Positive for rash.  Neurological: Negative for seizures and headaches.  Psychiatric/Behavioral: Negative for hallucinations.      Allergies  Penicillins  Home Medications   Prior to Admission medications   Medication Sig Start Date End Date Taking? Authorizing Provider  gabapentin (NEURONTIN) 600 MG tablet Take 600 mg by mouth 3 (three) times daily. 05/06/15  Yes Historical Provider, MD  hydrOXYzine (ATARAX/VISTARIL) 25 MG tablet Take 25 mg by mouth once.   Yes Historical Provider, MD   famotidine (PEPCID) 20 MG tablet Take 1 tablet (20 mg total) by mouth 2 (two) times daily. 05/29/15   Milton Ferguson, MD  predniSONE (DELTASONE) 10 MG tablet Take 2 tablets (20 mg total) by mouth daily. 05/29/15   Milton Ferguson, MD   BP 116/78 mmHg  Pulse 113  Temp(Src) 98 F (36.7 C) (Oral)  Resp 21  Ht 5\' 11"  (1.803 m)  Wt 185 lb (83.915 kg)  BMI 25.81 kg/m2  SpO2 98% Physical Exam  Constitutional: He is oriented to person, place, and time. He appears well-developed.  HENT:  Head: Normocephalic.  Eyes: Conjunctivae and EOM are normal. No scleral icterus.  Neck: Neck supple. No thyromegaly present.  Cardiovascular: Normal rate and regular rhythm.  Exam reveals no gallop and no friction rub.   No murmur heard. Pulmonary/Chest: No stridor. He has no wheezes. He has no rales. He exhibits no tenderness.  Abdominal: He exhibits no distension. There is no tenderness. There is no rebound.  Musculoskeletal: Normal range of motion. He exhibits no edema.  Lymphadenopathy:    He has no cervical adenopathy.  Neurological: He is oriented to person, place, and time. He exhibits normal muscle tone. Coordination normal.  Skin: Rash noted. No erythema.  Psychiatric: He has a normal mood and affect. His behavior is normal.    ED Course  Procedures (including critical care time) Labs Review Labs Reviewed - No data to display  Imaging Review No results found. I have personally reviewed and evaluated these images and lab results as part of my medical decision-making.   EKG Interpretation None  MDM   Final diagnoses:  Allergic reaction, initial encounter    Patient improved from his allergic reaction with epi along with Pepcid steroids and Benadryl. He is unsure of what set off his allergic reaction. Patient will be sent home with Pepcid and prednisone and told to take Benadryl for rash or itching. He is a follow-up with his PCP for possible referral to allergist.  Milton Ferguson,  MD 05/29/15 2351

## 2015-05-29 NOTE — Discharge Instructions (Signed)
Take benadryl for rash or itching.  Follow up with your md in a week. Return if problems

## 2020-04-10 ENCOUNTER — Ambulatory Visit: Payer: 59 | Admitting: Family Medicine

## 2020-04-10 ENCOUNTER — Encounter: Payer: Self-pay | Admitting: Family Medicine

## 2020-04-10 ENCOUNTER — Other Ambulatory Visit: Payer: Self-pay

## 2020-04-10 VITALS — BP 116/82 | HR 93 | Temp 98.4°F | Ht 69.5 in | Wt 160.2 lb

## 2020-04-10 DIAGNOSIS — K921 Melena: Secondary | ICD-10-CM

## 2020-04-10 DIAGNOSIS — M5442 Lumbago with sciatica, left side: Secondary | ICD-10-CM

## 2020-04-10 DIAGNOSIS — R569 Unspecified convulsions: Secondary | ICD-10-CM | POA: Insufficient documentation

## 2020-04-10 DIAGNOSIS — Z7689 Persons encountering health services in other specified circumstances: Secondary | ICD-10-CM | POA: Diagnosis not present

## 2020-04-10 DIAGNOSIS — Z113 Encounter for screening for infections with a predominantly sexual mode of transmission: Secondary | ICD-10-CM | POA: Diagnosis not present

## 2020-04-10 DIAGNOSIS — Z1389 Encounter for screening for other disorder: Secondary | ICD-10-CM

## 2020-04-10 DIAGNOSIS — Z13220 Encounter for screening for lipoid disorders: Secondary | ICD-10-CM

## 2020-04-10 DIAGNOSIS — Z23 Encounter for immunization: Secondary | ICD-10-CM

## 2020-04-10 DIAGNOSIS — M25512 Pain in left shoulder: Secondary | ICD-10-CM

## 2020-04-10 DIAGNOSIS — M21822 Other specified acquired deformities of left upper arm: Secondary | ICD-10-CM

## 2020-04-10 DIAGNOSIS — F1020 Alcohol dependence, uncomplicated: Secondary | ICD-10-CM | POA: Insufficient documentation

## 2020-04-10 DIAGNOSIS — G8929 Other chronic pain: Secondary | ICD-10-CM | POA: Insufficient documentation

## 2020-04-10 DIAGNOSIS — S71139A Puncture wound without foreign body, unspecified thigh, initial encounter: Secondary | ICD-10-CM | POA: Insufficient documentation

## 2020-04-10 LAB — CBC WITH DIFFERENTIAL/PLATELET
Basophils Absolute: 0 10*3/uL (ref 0.0–0.1)
Basophils Relative: 0.6 % (ref 0.0–3.0)
Eosinophils Absolute: 0.2 10*3/uL (ref 0.0–0.7)
Eosinophils Relative: 2.9 % (ref 0.0–5.0)
HCT: 40.3 % (ref 39.0–52.0)
Hemoglobin: 13.4 g/dL (ref 13.0–17.0)
Lymphocytes Relative: 40.8 % (ref 12.0–46.0)
Lymphs Abs: 2.4 10*3/uL (ref 0.7–4.0)
MCHC: 33.2 g/dL (ref 30.0–36.0)
MCV: 92.4 fl (ref 78.0–100.0)
Monocytes Absolute: 0.7 10*3/uL (ref 0.1–1.0)
Monocytes Relative: 11.5 % (ref 3.0–12.0)
Neutro Abs: 2.6 10*3/uL (ref 1.4–7.7)
Neutrophils Relative %: 44.2 % (ref 43.0–77.0)
Platelets: 271 10*3/uL (ref 150.0–400.0)
RBC: 4.36 Mil/uL (ref 4.22–5.81)
RDW: 13 % (ref 11.5–15.5)
WBC: 6 10*3/uL (ref 4.0–10.5)

## 2020-04-10 LAB — COMPREHENSIVE METABOLIC PANEL
ALT: 17 U/L (ref 0–53)
AST: 15 U/L (ref 0–37)
Albumin: 4.7 g/dL (ref 3.5–5.2)
Alkaline Phosphatase: 49 U/L (ref 39–117)
BUN: 17 mg/dL (ref 6–23)
CO2: 31 mEq/L (ref 19–32)
Calcium: 9.5 mg/dL (ref 8.4–10.5)
Chloride: 104 mEq/L (ref 96–112)
Creatinine, Ser: 1 mg/dL (ref 0.40–1.50)
GFR: 85.4 mL/min (ref >60.00)
Glucose, Bld: 92 mg/dL (ref 70–99)
Potassium: 5 mEq/L (ref 3.5–5.1)
Sodium: 140 mEq/L (ref 135–145)
Total Bilirubin: 0.6 mg/dL (ref 0.2–1.2)
Total Protein: 6.7 g/dL (ref 6.0–8.3)

## 2020-04-10 LAB — LIPID PANEL
Cholesterol: 140 mg/dL (ref 0–200)
HDL: 56.5 mg/dL (ref >39.00)
LDL Cholesterol: 68 mg/dL (ref 0–99)
NonHDL: 83.23
Total CHOL/HDL Ratio: 2
Triglycerides: 78 mg/dL (ref 0.0–149.0)
VLDL: 15.6 mg/dL (ref 0.0–40.0)

## 2020-04-10 MED ORDER — GABAPENTIN 600 MG PO TABS: 600.0000 mg | ORAL_TABLET | Freq: Three times a day (TID) | ORAL | 5 refills | Status: DC

## 2020-04-10 NOTE — Patient Instructions (Addendum)
Good to see you today  Please follow up in 6 months for complete physical  Try adding a stool softener or some Benefiber type fiber supplement
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
## 2020-04-11 LAB — HEPATITIS C ANTIBODY
Hepatitis C Ab: NONREACTIVE
SIGNAL TO CUT-OFF: 0.01 (ref <1.00)

## 2020-04-11 LAB — HIV ANTIBODY (ROUTINE TESTING W REFLEX): HIV 1&2 Ab, 4th Generation: NONREACTIVE

## 2020-04-24 ENCOUNTER — Other Ambulatory Visit: Payer: 59

## 2020-04-24 DIAGNOSIS — Z113 Encounter for screening for infections with a predominantly sexual mode of transmission: Secondary | ICD-10-CM

## 2020-04-25 LAB — C. TRACHOMATIS/N. GONORRHOEAE RNA
C. trachomatis RNA, TMA: NOT DETECTED
N. gonorrhoeae RNA, TMA: NOT DETECTED

## 2020-10-09 ENCOUNTER — Ambulatory Visit: Payer: 59 | Admitting: Family Medicine

## 2021-02-27 ENCOUNTER — Ambulatory Visit (INDEPENDENT_AMBULATORY_CARE_PROVIDER_SITE_OTHER): Payer: 59 | Admitting: Nurse Practitioner

## 2021-02-27 ENCOUNTER — Other Ambulatory Visit: Payer: Self-pay

## 2021-02-27 ENCOUNTER — Encounter: Payer: Self-pay | Admitting: Nurse Practitioner

## 2021-02-27 VITALS — BP 130/72 | HR 94 | Temp 98.4°F | Ht 70.25 in | Wt 160.0 lb

## 2021-02-27 DIAGNOSIS — Z0000 Encounter for general adult medical examination without abnormal findings: Secondary | ICD-10-CM | POA: Insufficient documentation

## 2021-02-27 DIAGNOSIS — G8929 Other chronic pain: Secondary | ICD-10-CM | POA: Diagnosis not present

## 2021-02-27 DIAGNOSIS — F988 Other specified behavioral and emotional disorders with onset usually occurring in childhood and adolescence: Secondary | ICD-10-CM | POA: Diagnosis not present

## 2021-02-27 DIAGNOSIS — M5442 Lumbago with sciatica, left side: Secondary | ICD-10-CM

## 2021-02-27 LAB — CBC
HCT: 40.6 % (ref 39.0–52.0)
Hemoglobin: 13.6 g/dL (ref 13.0–17.0)
MCHC: 33.6 g/dL (ref 30.0–36.0)
MCV: 90.6 fl (ref 78.0–100.0)
Platelets: 275 10*3/uL (ref 150.0–400.0)
RBC: 4.48 Mil/uL (ref 4.22–5.81)
RDW: 12.8 % (ref 11.5–15.5)
WBC: 6 10*3/uL (ref 4.0–10.5)

## 2021-02-27 LAB — COMPREHENSIVE METABOLIC PANEL
ALT: 18 U/L (ref 0–53)
AST: 21 U/L (ref 0–37)
Albumin: 4.8 g/dL (ref 3.5–5.2)
Alkaline Phosphatase: 50 U/L (ref 39–117)
BUN: 16 mg/dL (ref 6–23)
CO2: 31 mEq/L (ref 19–32)
Calcium: 9.6 mg/dL (ref 8.4–10.5)
Chloride: 103 mEq/L (ref 96–112)
Creatinine, Ser: 0.97 mg/dL (ref 0.40–1.50)
GFR: 101.39 mL/min (ref >60.00)
Glucose, Bld: 85 mg/dL (ref 70–99)
Potassium: 4.9 mEq/L (ref 3.5–5.1)
Sodium: 140 mEq/L (ref 135–145)
Total Bilirubin: 1.1 mg/dL (ref 0.2–1.2)
Total Protein: 7 g/dL (ref 6.0–8.3)

## 2021-02-27 LAB — LIPID PANEL
Cholesterol: 138 mg/dL (ref 0–200)
HDL: 64.3 mg/dL (ref >39.00)
LDL Cholesterol: 64 mg/dL (ref 0–99)
NonHDL: 73.69
Total CHOL/HDL Ratio: 2
Triglycerides: 49 mg/dL (ref 0.0–149.0)
VLDL: 9.8 mg/dL (ref 0.0–40.0)

## 2021-02-27 MED ORDER — GABAPENTIN 600 MG PO TABS: 600.0000 mg | ORAL_TABLET | Freq: Three times a day (TID) | ORAL | 5 refills | Status: DC

## 2021-02-27 NOTE — Assessment & Plan Note (Signed)
Currently managed by [facility] helper.  Maintained on Vyvanse 70 mg daily.  PDMP checked at office visit today. Continue Vyvanse 70 mg daily follow-up with [facility] helper as scheduled/directed

## 2021-02-27 NOTE — Patient Instructions (Signed)
Pleasure talking with you today Follow up with me in 6 months Will get labs today

## 2021-02-27 NOTE — Progress Notes (Signed)
Established Patient Office Visit  Subjective:  Patient ID: Darryl Crawford, male    DOB: 10-May-1986  Age: 35 y.o. MRN: [id number]  CC:  Chief Complaint  Patient presents with   Establish Care    Transfer from D. Gessner    HPI [employer] presents for transfer of care:  Bumps: noticed that he has been getting bumps on back legs, arms, face. Approx 2 years. States they bother him. States he scratches in his sleep and inflames them and they bother him. States he is has tired salisclicic acid, tee tree, vinegar, aloe. With minimal relief. Worse in he summer than winter time.    for complete physical.  Immunizations: -Tetanus:  1-2 yeats with injury at work -Influenza: UTD for last season -Covid-19:Moderna 1 and 2 -Shingles:na -Pneumonia: na    Diet: Fair diet. Eating at home Exercise: 3-4 times weekly with focus on care and cardio  Eye exam: never done Dental exam: Needs updated  Pap Smear: NA Mammogram: NA Dexa: NA Colonoscopy: NA paternal grandfather had colon ca at age 12 PSA: NA  Lung Cancer Screening: NA. Former smoker of approx. 8 years, 1ppd, Has been stopped for 7 years  Past Medical History:  Diagnosis Date   Anxiety    Depression    Hemorrhoids     Past Surgical History:  Procedure Laterality Date   ORTHOPEDIC SURGERY      Family History  Problem Relation Age of Onset   Depression Mother    Hyperlipidemia Father    Hypertension Father    Depression Brother    Diabetes Maternal Grandmother    Asthma Maternal Grandmother    Hyperlipidemia Maternal Grandmother    Asthma Maternal Grandfather    Birth defects Maternal Grandfather    Lupus Maternal Grandfather    Hypertension Paternal Grandfather     Social History   Socioeconomic History   Marital status: Single    Spouse name: Not on file   Number of children: Not on file   Years of education: Not on file   Highest education level: Not on file  Occupational History   Not on file   Tobacco Use   Smoking status: Former   Smokeless tobacco: Never  Substance and Sexual Activity   Alcohol use: No   Drug use: Not Currently    Types: Cocaine    Comment: denies   Sexual activity: Not on file  Other Topics Concern   Not on file  Social History Narrative   Not on file   Social Determinants of Health   Financial Resource Strain: Not on file  Food Insecurity: Not on file  Transportation Needs: Not on file  Physical Activity: Not on file  Stress: Not on file  Social Connections: Not on file  Intimate Partner Violence: Not on file    Outpatient Medications Prior to Visit  Medication Sig Dispense Refill   gabapentin (NEURONTIN) 600 MG tablet Take 1 tablet (600 mg total) by mouth 3 (three) times daily. 180 tablet 5   NAPROXEN PO Take 1-2 tablets by mouth daily.     VYVANSE 70 MG capsule Take 70 mg by mouth daily.     buPROPion (WELLBUTRIN XL) 150 MG 24 hr tablet Take 150 mg by mouth daily. (Patient not taking: No sig reported)     famotidine (PEPCID) 20 MG tablet Take 1 tablet (20 mg total) by mouth 2 (two) times daily. (Patient not taking: Reported on 02/27/2021) 10 tablet 0   No  facility-administered medications prior to visit.    Allergies  Allergen Reactions   Penicillins Other (See Comments)    Reaction: unknown    ROS Review of Systems  Constitutional:  Negative for chills, fatigue and unexpected weight change.  Eyes:  Negative for visual disturbance.  Respiratory:  Negative for cough, shortness of breath and wheezing.   Cardiovascular:  Negative for chest pain, palpitations and leg swelling.  Gastrointestinal:  Positive for abdominal pain. Negative for constipation, diarrhea, nausea and vomiting.  Genitourinary:  Negative for dysuria, penile discharge, penile pain, penile swelling, scrotal swelling and testicular pain.  Skin:  Positive for rash. Negative for color change and pallor.  Neurological:  Negative for weakness.     Objective:     Physical Exam Vitals and nursing note reviewed.  Constitutional:      Appearance: Normal appearance. He is normal weight.  HENT:     Right Ear: Tympanic membrane, ear canal and external ear normal.     Left Ear: Tympanic membrane, ear canal and external ear normal.     Mouth/Throat:     Mouth: Mucous membranes are moist.     Pharynx: Oropharynx is clear.  Eyes:     Extraocular Movements: Extraocular movements intact.     Pupils: Pupils are equal, round, and reactive to light.  Neck:     Vascular: No carotid bruit.  Cardiovascular:     Rate and Rhythm: Normal rate and regular rhythm.  Pulmonary:     Effort: Pulmonary effort is normal.     Breath sounds: Normal breath sounds.  Abdominal:     General: Abdomen is flat. Bowel sounds are normal.     Palpations: Abdomen is soft.  Musculoskeletal:     Right lower leg: No edema.     Left lower leg: No edema.  Lymphadenopathy:     Cervical: No cervical adenopathy.  Skin:    General: Skin is warm.  Neurological:     General: No focal deficit present.     Mental Status: He is alert.  Psychiatric:        Mood and Affect: Mood normal.        Thought Content: Thought content normal.    Pulse 94   Temp 98.4 F (36.9 C) (Temporal)   Ht 5' 10.25" (1.784 m)   Wt 160 lb (72.6 kg)   SpO2 97%   BMI 22.79 kg/m  Wt Readings from Last 3 Encounters:  02/27/21 160 lb (72.6 kg)  04/10/20 160 lb 4 oz (72.7 kg)  05/29/15 185 lb (83.9 kg)     Health Maintenance Due  Topic Date Due   Pneumococcal Vaccine 36-21 Years old (1 - PCV) Never done   TETANUS/TDAP  Never done   COVID-19 Vaccine (3 - Booster for Moderna series) 04/17/2020   INFLUENZA VACCINE  02/26/2021    There are no preventive care reminders to display for this patient.  Lab Results  Component Value Date   TSH 0.78 02/02/2012   Lab Results  Component Value Date   WBC 6.0 04/10/2020   HGB 13.4 04/10/2020   HCT 40.3 04/10/2020   MCV 92.4 04/10/2020   PLT 271.0  04/10/2020   Lab Results  Component Value Date   NA 140 04/10/2020   K 5.0 04/10/2020   CO2 31 04/10/2020   GLUCOSE 92 04/10/2020   BUN 17 04/10/2020   CREATININE 1.00 04/10/2020   BILITOT 0.6 04/10/2020   ALKPHOS 49 04/10/2020   AST 15 04/10/2020  ALT 17 04/10/2020   PROT 6.7 04/10/2020   ALBUMIN 4.7 04/10/2020   CALCIUM 9.5 04/10/2020   ANIONGAP 7 07/14/2014   GFR 85.40 04/10/2020   Lab Results  Component Value Date   CHOL 140 04/10/2020   Lab Results  Component Value Date   HDL 56.50 04/10/2020   Lab Results  Component Value Date   LDLCALC 68 04/10/2020   Lab Results  Component Value Date   TRIG 78.0 04/10/2020   Lab Results  Component Value Date   CHOLHDL 2 04/10/2020   No results found for: HGBA1C    Assessment & Plan:   Problem List Items Addressed This Visit       Nervous and Auditory   Chronic left-sided low back pain with left-sided sciatica    Patient states resultant from falling from a tree.  Currently maintained on gabapentin 600 mg 3 times daily.  Patient denies adverse drug events. Continue gabapentin 600 mg 3 times daily.       Relevant Medications   gabapentin (NEURONTIN) 600 MG tablet     Other   ADD (attention deficit disorder)    Currently managed by [facility] helper.  Maintained on Vyvanse 70 mg daily.  PDMP checked at office visit today. Continue Vyvanse 70 mg daily follow-up with [facility] helper as scheduled/directed       Preventative health care - Primary   Relevant Orders   CBC   Comprehensive metabolic panel   Lipid panel    No orders of the defined types were placed in this encounter.   Follow-up: Return in about 6 months (around 08/30/2021) for recheck on medication.   This visit occurred during the SARS-CoV-2 public health emergency.  Safety protocols were in place, including screening questions prior to the visit, additional usage of staff PPE, and extensive cleaning of exam room while observing appropriate contact time  as indicated for disinfecting solutions.   Romilda Garret, NP

## 2021-02-27 NOTE — Assessment & Plan Note (Signed)
Patient states resultant from falling from a tree.  Currently maintained on gabapentin 600 mg 3 times daily.  Patient denies adverse drug events. Continue gabapentin 600 mg 3 times daily.

## 2021-02-28 ENCOUNTER — Other Ambulatory Visit: Payer: Self-pay | Admitting: Nurse Practitioner

## 2021-02-28 DIAGNOSIS — D229 Melanocytic nevi, unspecified: Secondary | ICD-10-CM

## 2021-05-09 ENCOUNTER — Ambulatory Visit: Payer: 59 | Admitting: Dermatology

## 2021-05-14 ENCOUNTER — Ambulatory Visit: Payer: 59 | Admitting: Dermatology

## 2021-05-14 ENCOUNTER — Other Ambulatory Visit: Payer: Self-pay

## 2021-05-14 DIAGNOSIS — Z1283 Encounter for screening for malignant neoplasm of skin: Secondary | ICD-10-CM | POA: Diagnosis not present

## 2021-05-14 DIAGNOSIS — M67422 Ganglion, left elbow: Secondary | ICD-10-CM

## 2021-05-14 DIAGNOSIS — L739 Follicular disorder, unspecified: Secondary | ICD-10-CM

## 2021-05-14 DIAGNOSIS — M6740 Ganglion, unspecified site: Secondary | ICD-10-CM

## 2021-05-14 MED ORDER — CLINDAMYCIN PHOSPHATE 1 % EX LOTN: TOPICAL_LOTION | Freq: Every day | CUTANEOUS | 6 refills | Status: DC

## 2021-05-14 NOTE — Patient Instructions (Signed)

## 2021-05-14 NOTE — Progress Notes (Signed)
   New Patient Visit  Darryl Crawford is a 35 y.o. male who presents for the following: Other (Bump of left elbow. Spots of arms, face and trunk that feel like warts.). The patient presents for Upper Body Skin Exam (UBSE) for skin cancer screening and mole check.  The following portions of the chart were reviewed this encounter and updated as appropriate:   Tobacco  Allergies  Meds  Problems  Med Hx  Surg Hx  Fam Hx     Review of Systems:  No other skin or systemic complaints except as noted in HPI or Assessment and Plan.  Objective  Well appearing patient in no apparent distress; mood and affect are within normal limits.  All skin waist up examined.  Arms Follicular based pink papules, some with crusts.  Left Elbow - Posterior Cystic papule   Assessment & Plan  Folliculitis Arms; shoulders Can persistent for at least a year. Start Clindamycin lotion qhs. If not improving after a month, start [place] - patient will call if not improving.  May consider Doxycycline in the future if not improving.  clindamycin (CLEOCIN-T) 1 % lotion - Arms Apply topically at bedtime.  Ganglion cyst Left Elbow - Posterior Benign.  Likely related to trauma.  Discussed condition and treatment options. Do not recommend treatment at this time - may consider if it becomes more symptomatic. Discussed intralesional injection with steroid or Asclera.  May consider orthopedic surgery evaluation 2.  Skin cancer screening  Return in about 3 months (around 08/14/2021).  I, Ashok Cordia, CMA, am acting as scribe for Sarina Ser, MD . Documentation: I have reviewed the above documentation for accuracy and completeness, and I agree with the above.  Sarina Ser, MD

## 2021-05-15 ENCOUNTER — Encounter: Payer: Self-pay | Admitting: Dermatology

## 2021-08-11 ENCOUNTER — Other Ambulatory Visit: Payer: Self-pay | Admitting: Nurse Practitioner

## 2021-08-11 DIAGNOSIS — M5442 Lumbago with sciatica, left side: Secondary | ICD-10-CM

## 2021-08-11 DIAGNOSIS — G8929 Other chronic pain: Secondary | ICD-10-CM

## 2021-08-11 NOTE — Telephone Encounter (Signed)
Refill request for Gabapentin 600 mg tabs  LOV - 02/27/21 Next OV - 09/15/21 Last refill - 02/27/21 #90/3

## 2021-08-12 ENCOUNTER — Other Ambulatory Visit: Payer: Self-pay | Admitting: Nurse Practitioner

## 2021-08-12 DIAGNOSIS — G8929 Other chronic pain: Secondary | ICD-10-CM

## 2021-08-13 ENCOUNTER — Ambulatory Visit: Payer: 59 | Admitting: Dermatology

## 2021-08-13 MED ORDER — GABAPENTIN 600 MG PO TABS: 600.0000 mg | ORAL_TABLET | Freq: Three times a day (TID) | ORAL | 1 refills | Status: DC

## 2021-08-14 ENCOUNTER — Ambulatory Visit: Payer: 59 | Admitting: Dermatology

## 2021-09-05 ENCOUNTER — Ambulatory Visit: Payer: 59 | Admitting: Nurse Practitioner

## 2021-09-13 ENCOUNTER — Ambulatory Visit: Payer: 59 | Admitting: Nurse Practitioner

## 2021-09-13 ENCOUNTER — Encounter: Payer: Self-pay | Admitting: Nurse Practitioner

## 2021-09-13 ENCOUNTER — Other Ambulatory Visit: Payer: Self-pay

## 2021-09-13 VITALS — BP 130/82 | HR 85 | Temp 98.3°F | Resp 12 | Ht 70.25 in | Wt 167.0 lb

## 2021-09-13 DIAGNOSIS — M654 Radial styloid tenosynovitis [de Quervain]: Secondary | ICD-10-CM | POA: Diagnosis not present

## 2021-09-13 DIAGNOSIS — M436 Torticollis: Secondary | ICD-10-CM | POA: Diagnosis not present

## 2021-09-13 DIAGNOSIS — M5442 Lumbago with sciatica, left side: Secondary | ICD-10-CM | POA: Diagnosis not present

## 2021-09-13 DIAGNOSIS — Z23 Encounter for immunization: Secondary | ICD-10-CM

## 2021-09-13 DIAGNOSIS — G8929 Other chronic pain: Secondary | ICD-10-CM

## 2021-09-13 DIAGNOSIS — F988 Other specified behavioral and emotional disorders with onset usually occurring in childhood and adolescence: Secondary | ICD-10-CM | POA: Diagnosis not present

## 2021-09-13 MED ORDER — GABAPENTIN 600 MG PO TABS: 600.0000 mg | ORAL_TABLET | Freq: Three times a day (TID) | ORAL | 1 refills | Status: DC

## 2021-09-13 MED ORDER — METHOCARBAMOL 500 MG PO TABS: 500.0000 mg | ORAL_TABLET | Freq: Two times a day (BID) | ORAL | 0 refills | Status: AC | PRN

## 2021-09-13 NOTE — Assessment & Plan Note (Signed)
Managed by [facility] helper.  Vyvanse 70 mg daily.  Continue medication as prescribed and follow-up as scheduled

## 2021-09-13 NOTE — Progress Notes (Signed)
Established Patient Office Visit  Subjective:  Patient ID: Darryl Crawford, male    DOB: Jan 30, 1986  Age: 36 y.o. MRN: [id number]  CC:  Chief Complaint  Patient presents with   Follow-up    6 months    HPI [facility] presents for 6 month follow up  Chronic left-sided low back pain: resultant from falling from a tree resulting in injury. Currently takes gabapentin for the sciatica   ADD: currently maintained on vyvanse. BP is slightly elevated in office.   Skin issues: was seen by dermatology and dx with folliculitis. States one cream did not work and the other was too expensive.  Neck pain/ Right shoulder: started this morning. No injury. Hurts with positional head turns to the right.  Sharp pain that happens with movement.   Past Medical History:  Diagnosis Date   Anxiety    Depression    Hemorrhoids     Past Surgical History:  Procedure Laterality Date   ORTHOPEDIC SURGERY      Family History  Problem Relation Age of Onset   Depression Mother    Hyperlipidemia Father    Hypertension Father    Depression Brother    Diabetes Maternal Grandmother    Asthma Maternal Grandmother    Hyperlipidemia Maternal Grandmother    Asthma Maternal Grandfather    Birth defects Maternal Grandfather    Lupus Maternal Grandfather    Hypertension Paternal Grandfather     Social History   Socioeconomic History   Marital status: Single    Spouse name: Not on file   Number of children: Not on file   Years of education: Not on file   Highest education level: Not on file  Occupational History   Not on file  Tobacco Use   Smoking status: Former   Smokeless tobacco: Never  Substance and Sexual Activity   Alcohol use: No   Drug use: Not Currently    Types: Cocaine    Comment: denies   Sexual activity: Not on file  Other Topics Concern   Not on file  Social History Narrative   Not on file   Social Determinants of Health   Financial Resource Strain: Not on file   Food Insecurity: Not on file  Transportation Needs: Not on file  Physical Activity: Not on file  Stress: Not on file  Social Connections: Not on file  Intimate Partner Violence: Not on file    Outpatient Medications Prior to Visit  Medication Sig Dispense Refill   gabapentin (NEURONTIN) 600 MG tablet Take 1 tablet (600 mg total) by mouth 3 (three) times daily. 90 tablet 1   NAPROXEN PO Take 1-2 tablets by mouth daily.     VYVANSE 70 MG capsule Take 70 mg by mouth daily.     clindamycin (CLEOCIN-T) 1 % lotion Apply topically at bedtime. 60 mL 6   No facility-administered medications prior to visit.    Allergies  Allergen Reactions   Penicillins Other (See Comments)    Reaction: unknown    ROS Review of Systems  Constitutional:  Negative for chills and fever.  Respiratory:  Negative for cough and shortness of breath.   Cardiovascular:  Negative for chest pain.  Gastrointestinal:  Negative for diarrhea, nausea and vomiting.       BM everyday  Musculoskeletal:  Positive for arthralgias and back pain.  Neurological:  Negative for dizziness, light-headedness, numbness and headaches.  Psychiatric/Behavioral:  Negative for hallucinations and suicidal ideas.  Objective:    Physical Exam Vitals and nursing note reviewed.  Constitutional:      Appearance: Normal appearance.  Cardiovascular:     Rate and Rhythm: Normal rate and regular rhythm.     Heart sounds: Normal heart sounds.  Pulmonary:     Effort: Pulmonary effort is normal.     Breath sounds: Normal breath sounds.  Musculoskeletal:       Arms:     Cervical back: Torticollis present. No rigidity.     Lumbar back: No tenderness or bony tenderness. Negative right straight leg raise test and negative left straight leg raise test.       Back:     Right lower leg: No edema.     Left lower leg: No edema.     Comments: + finkelstein on right side.  Neurological:     General: No focal deficit present.     Mental  Status: He is alert.     Motor: No weakness.     Deep Tendon Reflexes: Reflexes normal.    BP 130/68    Pulse 85    Temp 98.3 F (36.8 C)    Resp 12    Ht 5' 10.25" (1.784 m)    Wt 167 lb (75.8 kg)    SpO2 99%    BMI 23.79 kg/m  Wt Readings from Last 3 Encounters:  09/13/21 167 lb (75.8 kg)  02/27/21 160 lb (72.6 kg)  04/10/20 160 lb 4 oz (72.7 kg)     Health Maintenance Due  Topic Date Due   TETANUS/TDAP  Never done   COVID-19 Vaccine (3 - Booster for Moderna series) 01/11/2020   INFLUENZA VACCINE  02/26/2021    There are no preventive care reminders to display for this patient.  Lab Results  Component Value Date   TSH 0.78 02/02/2012   Lab Results  Component Value Date   WBC 6.0 02/27/2021   HGB 13.6 02/27/2021   HCT 40.6 02/27/2021   MCV 90.6 02/27/2021   PLT 275.0 02/27/2021   Lab Results  Component Value Date   NA 140 02/27/2021   K 4.9 02/27/2021   CO2 31 02/27/2021   GLUCOSE 85 02/27/2021   BUN 16 02/27/2021   CREATININE 0.97 02/27/2021   BILITOT 1.1 02/27/2021   ALKPHOS 50 02/27/2021   AST 21 02/27/2021   ALT 18 02/27/2021   PROT 7.0 02/27/2021   ALBUMIN 4.8 02/27/2021   CALCIUM 9.6 02/27/2021   ANIONGAP 7 07/14/2014   GFR 101.39 02/27/2021   Lab Results  Component Value Date   CHOL 138 02/27/2021   Lab Results  Component Value Date   HDL 64.30 02/27/2021   Lab Results  Component Value Date   LDLCALC 64 02/27/2021   Lab Results  Component Value Date   TRIG 49.0 02/27/2021   Lab Results  Component Value Date   CHOLHDL 2 02/27/2021   No results found for: HGBA1C    Assessment & Plan:   Problem List Items Addressed This Visit       Nervous and Auditory   Chronic left-sided low back pain with left-sided sciatica    Patient currently maintained on gabapentin.  Doing well on medication per report.  Continue gabapentin      Relevant Medications   methocarbamol (ROBAXIN) 500 MG tablet   gabapentin (NEURONTIN) 600 MG tablet      Musculoskeletal and Integument   Tendinitis, de Quervain's    Tendinitis.  Told patient to use NSAIDs over-the-counter  and gave information and education about tendinitis      Torticollis - Primary    Likely related to patient sleeping oddly enough before.  Patient can move neck does hurt when he does certain movements to the right to feel the muscle pull in his lower neck upper back.  Try methocarbamol 500 mg twice daily as needed      Relevant Medications   methocarbamol (ROBAXIN) 500 MG tablet     Other   ADD (attention deficit disorder)    Managed by [facility] helper.  Vyvanse 70 mg daily.  Continue medication as prescribed and follow-up as scheduled      Other Visit Diagnoses     Need for influenza vaccination       Relevant Orders   Flu Vaccine QUAD 6+ mos PF IM (Fluarix Quad PF) (Completed)       Meds ordered this encounter  Medications   methocarbamol (ROBAXIN) 500 MG tablet    Sig: Take 1 tablet (500 mg total) by mouth 2 (two) times daily as needed for up to 7 days for muscle spasms.    Dispense:  14 tablet    Refill:  0    Order Specific Question:   Supervising Provider    Answer:   TOWER, MARNE A [[id number]]   gabapentin (NEURONTIN) 600 MG tablet    Sig: Take 1 tablet (600 mg total) by mouth 3 (three) times daily.    Dispense:  90 tablet    Refill:  1    Do not fill until patient requests    Order Specific Question:   Supervising Provider    Answer:   Loura Pardon A [[id number]]    Follow-up: Return in about 6 months (around 03/13/2022) for CPE.   This visit occurred during the SARS-CoV-2 public health emergency.  Safety protocols were in place, including screening questions prior to the visit, additional usage of staff PPE, and extensive cleaning of exam room while observing appropriate contact time as indicated for disinfecting solutions.   Romilda Garret, NP

## 2021-09-13 NOTE — Patient Instructions (Signed)
Nice to see you today I sent in a muscle relaxer to the pharmacy and refilled gabapentin I want to see you in 6 months for a physical, sooner if you need me  Can take NSAIDs like ibuprofen or Aleve to help with the wrist

## 2021-09-13 NOTE — Assessment & Plan Note (Signed)
Tendinitis.  Told patient to use NSAIDs over-the-counter and gave information and education about tendinitis

## 2021-09-13 NOTE — Assessment & Plan Note (Signed)
Likely related to patient sleeping oddly enough before.  Patient can move neck does hurt when he does certain movements to the right to feel the muscle pull in his lower neck upper back.  Try methocarbamol 500 mg twice daily as needed

## 2021-09-13 NOTE — Assessment & Plan Note (Signed)
Patient currently maintained on gabapentin.  Doing well on medication per report.  Continue gabapentin

## 2021-10-19 ENCOUNTER — Encounter: Payer: Self-pay | Admitting: Nurse Practitioner

## 2021-10-19 ENCOUNTER — Telehealth: Payer: 59 | Admitting: Nurse Practitioner

## 2021-10-19 ENCOUNTER — Other Ambulatory Visit: Payer: Self-pay

## 2021-10-19 VITALS — Temp 98.0°F

## 2021-10-19 DIAGNOSIS — R0982 Postnasal drip: Secondary | ICD-10-CM | POA: Diagnosis not present

## 2021-10-19 DIAGNOSIS — G8929 Other chronic pain: Secondary | ICD-10-CM | POA: Diagnosis not present

## 2021-10-19 DIAGNOSIS — U071 COVID-19: Secondary | ICD-10-CM | POA: Diagnosis not present

## 2021-10-19 DIAGNOSIS — M5442 Lumbago with sciatica, left side: Secondary | ICD-10-CM | POA: Diagnosis not present

## 2021-10-19 MED ORDER — LEVOCETIRIZINE DIHYDROCHLORIDE 5 MG PO TABS: 5.0000 mg | ORAL_TABLET | Freq: Every evening | ORAL | 0 refills | Status: DC

## 2021-10-19 MED ORDER — FLUTICASONE PROPIONATE 50 MCG/ACT NA SUSP: 2.0000 | Freq: Every day | NASAL | 0 refills | Status: DC

## 2021-10-19 MED ORDER — METHOCARBAMOL 500 MG PO TABS: 500.0000 mg | ORAL_TABLET | Freq: Two times a day (BID) | ORAL | 0 refills | Status: AC | PRN

## 2021-10-19 NOTE — Assessment & Plan Note (Signed)
Patient tested positive for COVID-19 at home.  Patient is a young 36 year old male with no risk factors besides being a former smoker.  He is vaccinated x2.  Did discuss possibility of antivirals but patient does not qualify for the EUA lines.  We will send in prescription for symptomatic treatment inclusive of antihistamine and Flonase for the postnasal drip.  Patient is starting to improve with his symptoms.  Did review recent guidelines in regards to self isolation/quarantine.  Patient states his employer requires him to have a COVID test performed at a facility.  He has had swab done at [facility] pending result.  Signs and symptoms reviewed to seek urgent or emergent health care.  Follow-up if no improvement ?

## 2021-10-19 NOTE — Progress Notes (Signed)
? ? Patient ID: Darryl Crawford, male    DOB: 1986/04/13, 36 y.o.   MRN: [id number] ? ?Virtual visit completed through [employer], a video enabled telemedicine application. Due to national recommendations of social distancing due to COVID-19, a virtual visit is felt to be most appropriate for this patient at this time. Reviewed limitations, risks, security and privacy concerns of performing a virtual visit and the availability of in person appointments. I also reviewed that there may be a patient responsible charge related to this service. The patient agreed to proceed.  ? ?Patient location: home ?Provider location: Financial controller at [facility], office ?Persons participating in this virtual visit: patient, provider  ? ?If any vitals were documented, they were collected by patient at home unless specified below.   ? ?Temp 98 ?F (36.7 ?C) Comment: per patient  ? ?CC: Covid 19 ?Subjective:  ? ?HPI: ?Darryl Crawford is a 35 y.o. male presenting on 10/19/2021 for Covid Positive (On 10/17/21, sx started on 10/16/21-chest congestion, had a headache x 2 days, runny nose, coughing some, post nasal drip. No sore throat. First 2 days had a fever of around 101 or 100.) ? ? ?Symptoms started on 10/16/2021 ?Covid test on 10/17/2021  that was positive ?Covid vaccine x2 with no booster ?No sick contacts ?Has been using mucinex at bedtime, with relief  ? ? ? ?Relevant past medical, surgical, family and social history reviewed and updated as indicated. Interim medical history since our last visit reviewed. ?Allergies and medications reviewed and updated. ?Outpatient Medications Prior to Visit  ?Medication Sig Dispense Refill  ? gabapentin (NEURONTIN) 600 MG tablet Take 1 tablet (600 mg total) by mouth 3 (three) times daily. 90 tablet 1  ? NAPROXEN PO Take 1-2 tablets by mouth daily.    ? VYVANSE 70 MG capsule Take 70 mg by mouth daily.    ? ?No facility-administered medications prior to visit.  ?  ? ?Per HPI unless specifically indicated in ROS  section below ?Review of Systems  ?Constitutional:  Positive for appetite change, chills, fatigue and fever (frist couple days he had a fever of 101. resolving).  ?HENT:  Positive for congestion, postnasal drip, rhinorrhea and sinus pressure. Negative for ear discharge, ear pain, sinus pain and sore throat.   ?Respiratory:  Positive for cough and chest tightness. Negative for shortness of breath.   ?Cardiovascular:  Negative for chest pain.  ?Gastrointestinal:  Positive for abdominal pain. Negative for diarrhea, nausea and vomiting.  ?Musculoskeletal:  Positive for myalgias.  ?Neurological:  Positive for headaches.  ?Objective:  ?Temp 98 ?F (36.7 ?C) Comment: per patient  ?Wt Readings from Last 3 Encounters:  ?09/13/21 167 lb (75.8 kg)  ?02/27/21 160 lb (72.6 kg)  ?04/10/20 160 lb 4 oz (72.7 kg)  ?  ?  ? ?Physical exam: ?Gen: alert, NAD, not ill appearing ?Pulm: speaks in complete sentences without increased work of breathing ?Psych: normal mood, normal thought content  ? ?   ?Results for orders placed or performed in visit on 02/27/21  ?CBC  ?Result Value Ref Range  ? WBC 6.0 4.0 - 10.5 K/uL  ? RBC 4.48 4.22 - 5.81 Mil/uL  ? Platelets 275.0 150.0 - 400.0 K/uL  ? Hemoglobin 13.6 13.0 - 17.0 g/dL  ? HCT 40.6 39.0 - 52.0 %  ? MCV 90.6 78.0 - 100.0 fl  ? MCHC 33.6 30.0 - 36.0 g/dL  ? RDW 12.8 11.5 - 15.5 %  ?Comprehensive metabolic panel  ?Result Value Ref Range  ?  Sodium 140 135 - 145 mEq/L  ? Potassium 4.9 3.5 - 5.1 mEq/L  ? Chloride 103 96 - 112 mEq/L  ? CO2 31 19 - 32 mEq/L  ? Glucose, Bld 85 70 - 99 mg/dL  ? BUN 16 6 - 23 mg/dL  ? Creatinine, Ser 0.97 0.40 - 1.50 mg/dL  ? Total Bilirubin 1.1 0.2 - 1.2 mg/dL  ? Alkaline Phosphatase 50 39 - 117 U/L  ? AST 21 0 - 37 U/L  ? ALT 18 0 - 53 U/L  ? Total Protein 7.0 6.0 - 8.3 g/dL  ? Albumin 4.8 3.5 - 5.2 g/dL  ? GFR 101.39 >60.00 mL/min  ? Calcium 9.6 8.4 - 10.5 mg/dL  ?Lipid panel  ?Result Value Ref Range  ? Cholesterol 138 0 - 200 mg/dL  ? Triglycerides 49.0 0.0 -  149.0 mg/dL  ? HDL 64.30 >39.00 mg/dL  ? VLDL 9.8 0.0 - 40.0 mg/dL  ? LDL Cholesterol 64 0 - 99 mg/dL  ? Total CHOL/HDL Ratio 2   ? NonHDL 73.69   ? ?Assessment & Plan:  ? ?Problem List Items Addressed This Visit   ? ?  ? Nervous and Auditory  ? Chronic left-sided low back pain with left-sided sciatica  ?  Patient requested a refill of the methocarbamol states that he went half tablets and did well with it.  We will send in a 10-day supply of methocarbamol 500 mg twice daily as needed. ?  ?  ? Relevant Medications  ? methocarbamol (ROBAXIN) 500 MG tablet  ?  ? Other  ? PND (post-nasal drip)  ?  We will send in some Flonase 2 sprays each nostril daily.  Also start levocetirizine 5 mg daily. ?  ?  ? Relevant Medications  ? fluticasone (FLONASE) 50 MCG/ACT nasal spray  ? levocetirizine (XYZAL) 5 MG tablet  ? COVID-19 - Primary  ?  Patient tested positive for COVID-19 at home.  Patient is a young 36 year old male with no risk factors besides being a former smoker.  He is vaccinated x2.  Did discuss possibility of antivirals but patient does not qualify for the EUA lines.  We will send in prescription for symptomatic treatment inclusive of antihistamine and Flonase for the postnasal drip.  Patient is starting to improve with his symptoms.  Did review recent guidelines in regards to self isolation/quarantine.  Patient states his employer requires him to have a COVID test performed at a facility.  He has had swab done at [facility] pending result.  Signs and symptoms reviewed to seek urgent or emergent health care.  Follow-up if no improvement ?  ?  ? Relevant Medications  ? fluticasone (FLONASE) 50 MCG/ACT nasal spray  ? levocetirizine (XYZAL) 5 MG tablet  ?  ? ?Meds ordered this encounter  ?Medications  ? fluticasone (FLONASE) 50 MCG/ACT nasal spray  ?  Sig: Place 2 sprays into both nostrils daily.  ?  Dispense:  16 g  ?  Refill:  0  ?  Order Specific Question:   Supervising Provider  ?  Answer:   Loura Pardon A [[id number]]  ?  levocetirizine (XYZAL) 5 MG tablet  ?  Sig: Take 1 tablet (5 mg total) by mouth every evening.  ?  Dispense:  30 tablet  ?  Refill:  0  ?  Order Specific Question:   Supervising Provider  ?  Answer:   Loura Pardon A [[id number]]  ? methocarbamol (ROBAXIN) 500 MG tablet  ?  Sig: Take 1  tablet (500 mg total) by mouth 2 (two) times daily as needed for up to 10 days for muscle spasms.  ?  Dispense:  20 tablet  ?  Refill:  0  ?  Order Specific Question:   Supervising Provider  ?  Answer:   Loura Pardon A [[id number]]  ? ?No orders of the defined types were placed in this encounter. ? ? ?I discussed the assessment and treatment plan with the patient. The patient was provided an opportunity to ask questions and all were answered. The patient agreed with the plan and demonstrated an understanding of the instructions. The patient was advised to call back or seek an in-person evaluation if the symptoms worsen or if the condition fails to improve as anticipated. ? ?Follow up plan: ?Return if symptoms worsen or fail to improve, for as scheduled. ? ?Romilda Garret, NP   ?

## 2021-10-19 NOTE — Assessment & Plan Note (Signed)
We will send in some Flonase 2 sprays each nostril daily.  Also start levocetirizine 5 mg daily. ?

## 2021-10-19 NOTE — Assessment & Plan Note (Signed)
Patient requested a refill of the methocarbamol states that he went half tablets and did well with it.  We will send in a 10-day supply of methocarbamol 500 mg twice daily as needed. ?

## 2021-10-19 NOTE — Patient Instructions (Signed)
Nice to see you today!

## 2021-10-29 ENCOUNTER — Other Ambulatory Visit: Payer: Self-pay | Admitting: Nurse Practitioner

## 2021-10-29 DIAGNOSIS — R0982 Postnasal drip: Secondary | ICD-10-CM

## 2021-10-29 DIAGNOSIS — U071 COVID-19: Secondary | ICD-10-CM

## 2021-11-10 ENCOUNTER — Other Ambulatory Visit: Payer: Self-pay | Admitting: Nurse Practitioner

## 2021-11-10 DIAGNOSIS — U071 COVID-19: Secondary | ICD-10-CM

## 2021-11-10 DIAGNOSIS — R0982 Postnasal drip: Secondary | ICD-10-CM

## 2021-12-22 ENCOUNTER — Other Ambulatory Visit: Payer: Self-pay | Admitting: Nurse Practitioner

## 2021-12-22 ENCOUNTER — Telehealth: Payer: 59 | Admitting: Nurse Practitioner

## 2021-12-22 DIAGNOSIS — M5442 Lumbago with sciatica, left side: Secondary | ICD-10-CM

## 2021-12-22 DIAGNOSIS — G8929 Other chronic pain: Secondary | ICD-10-CM | POA: Diagnosis not present

## 2021-12-22 MED ORDER — GABAPENTIN 600 MG PO TABS: 600.0000 mg | ORAL_TABLET | Freq: Three times a day (TID) | ORAL | 0 refills | Status: DC
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
## 2021-12-22 NOTE — Patient Instructions (Signed)
Chronic Back Pain When back pain lasts longer than 3 months, it is called chronic back pain. Pain may get worse at certain times (flare-ups). There are things you can do at home to manage your pain. Follow these instructions at home: Pay attention to any changes in your symptoms. Take these actions to help with your pain: Managing pain and stiffness     If told, put ice on the painful area. Your doctor may tell you to use ice for 24-48 hours after the flare-up starts. To do this: Put ice in a plastic bag. Place a towel between your skin and the bag. Leave the ice on for 20 minutes, 2-3 times a day. If told, put heat on the painful area. Do this as often as told by your doctor. Use the heat source that your doctor recommends, such as a moist heat pack or a heating pad. Place a towel between your skin and the heat source. Leave the heat on for 20-30 minutes. Take off the heat if your skin turns bright red. This is especially important if you are unable to feel pain, heat, or cold. You may have a greater risk of getting burned. Soak in a warm bath. This can help relieve pain. Activity  Avoid bending and other activities that make pain worse. When standing: Keep your upper back and neck straight. Keep your shoulders pulled back. Avoid slouching. When sitting: Keep your back straight. Relax your shoulders. Do not round your shoulders or pull them backward. Do not sit or stand in one place for long periods of time. Take short rest breaks during the day. Lying down or standing is usually better than sitting. Resting can help relieve pain. When sitting or lying down for a long time, do some mild activity or stretching. This will help to prevent stiffness and pain. Get regular exercise. Ask your doctor what activities are safe for you. Do not lift anything that is heavier than 10 lb (4.5 kg) or the limit that you are told, until your doctor says that it is safe. To prevent injury when you lift  things: Bend your knees. Keep the weight close to your body. Avoid twisting. Sleep on a firm mattress. Try lying on your side with your knees slightly bent. If you lie on your back, put a pillow under your knees. Medicines Treatment may include medicines for pain and swelling taken by mouth or put on the skin, prescription pain medicine, or muscle relaxants. Take over-the-counter and prescription medicines only as told by your doctor. Ask your doctor if the medicine prescribed to you: Requires you to avoid driving or using machinery. Can cause trouble pooping (constipation). You may need to take these actions to prevent or treat trouble pooping: Drink enough fluid to keep your pee (urine) pale yellow. Take over-the-counter or prescription medicines. Eat foods that are high in fiber. These include beans, whole grains, and fresh fruits and vegetables. Limit foods that are high in fat and sugars. These include fried or sweet foods. General instructions Do not use any products that contain nicotine or tobacco, such as cigarettes, e-cigarettes, and chewing tobacco. If you need help quitting, ask your doctor. Keep all follow-up visits as told by your doctor. This is important. Contact a doctor if: Your pain does not get better with rest or medicine. Your pain gets worse, or you have new pain. You have a high fever. You lose weight very quickly. You have trouble doing your normal activities. Get help right away   if: One or both of your legs or feet feel weak. One or both of your legs or feet lose feeling (have numbness). You have trouble controlling when you poop (have a bowel movement) or pee (urinate). You have bad back pain and: You feel like you may vomit (nauseous), or you vomit. You have pain in your belly (abdomen). You have shortness of breath. You faint. Summary When back pain lasts longer than 3 months, it is called chronic back pain. Pain may get worse at certain times  (flare-ups). Use ice and heat as told by your doctor. Your doctor may tell you to use ice after flare-ups. This information is not intended to replace advice given to you by your health care provider. Make sure you discuss any questions you have with your health care provider. Document Revised: 08/25/2019 Document Reviewed: 08/25/2019 Elsevier Patient Education  2023 Elsevier Inc.  

## 2022-01-09 ENCOUNTER — Ambulatory Visit (INDEPENDENT_AMBULATORY_CARE_PROVIDER_SITE_OTHER)
Admission: RE | Admit: 2022-01-09 | Discharge: 2022-01-09 | Disposition: A | Discharge status: Home / Self Care | Payer: 59 | Source: Ambulatory Visit | Attending: Nurse Practitioner | Admitting: Nurse Practitioner

## 2022-01-09 ENCOUNTER — Encounter: Payer: Self-pay | Admitting: Nurse Practitioner

## 2022-01-09 ENCOUNTER — Ambulatory Visit: Payer: 59 | Admitting: Nurse Practitioner

## 2022-01-09 VITALS — BP 124/82 | HR 84 | Temp 97.2°F | Resp 14 | Ht 70.25 in | Wt 164.4 lb

## 2022-01-09 DIAGNOSIS — M25531 Pain in right wrist: Secondary | ICD-10-CM | POA: Diagnosis not present

## 2022-01-09 DIAGNOSIS — M67431 Ganglion, right wrist: Secondary | ICD-10-CM

## 2022-01-09 DIAGNOSIS — G8929 Other chronic pain: Secondary | ICD-10-CM

## 2022-01-09 DIAGNOSIS — M5442 Lumbago with sciatica, left side: Secondary | ICD-10-CM

## 2022-01-09 MED ORDER — GABAPENTIN 600 MG PO TABS: 600.0000 mg | ORAL_TABLET | Freq: Three times a day (TID) | ORAL | 5 refills | Status: DC

## 2022-01-09 NOTE — Assessment & Plan Note (Signed)
Secondary to ganglion cyst.  Patient states with certain movements and striking cyst causes pain.  No injury per patient report.  Pending x-ray

## 2022-01-09 NOTE — Assessment & Plan Note (Signed)
Likely ganglion cyst.  Patient states sometimes his thumb will get stuck.  Sounds like a trigger finger with tendon being trapped will get x-ray of right wrist today.  If no bony abnormalities can consider referral to hand for evaluation of ganglion cyst drainage and/or removal.

## 2022-01-09 NOTE — Progress Notes (Signed)
   Established Patient Office Visit  Subjective   Patient ID: Darryl Crawford, male    DOB: 1985-08-04  Age: 36 y.o. MRN: [id number]  Chief Complaint  Patient presents with   Medication Refill    gabapentin   Cyst    On right wrist, pain present. This was an issue when he was seen in February.      Wrist: States that it is bothering him daily. States that he has pain and thinks he has a cyst or bone spur. States that his thumb will get stuck. Will get pain with movement and if he hits the Cyst. Has been taking ibuprofen that is helpful. States it has been the same size since he noticed it. Noticed it approx 2 weeks after our last office visit  Medication refill: just needs refill on gabapentin.     Review of Systems  Constitutional:  Negative for chills and fever.  Musculoskeletal:  Positive for joint pain.  Neurological:  Negative for tingling and weakness.      Objective:     BP 124/82   Pulse 84   Temp (!) 97.2 F (36.2 C)   Resp 14   Ht 5' 10.25" (1.784 m)   Wt 164 lb 6 oz (74.6 kg)   SpO2 98%   BMI 23.42 kg/m    Physical Exam Vitals and nursing note reviewed.  Constitutional:      Appearance: Normal appearance.  Cardiovascular:     Rate and Rhythm: Normal rate and regular rhythm.     Heart sounds: Normal heart sounds.  Pulmonary:     Effort: Pulmonary effort is normal.     Breath sounds: Normal breath sounds.  Musculoskeletal:        General: Normal range of motion.       Arms:  Neurological:     Mental Status: He is alert.      No results found for any visits on 01/09/22.    The ASCVD Risk score (Arnett DK, et al., 2019) failed to calculate for the following reasons:   The 2019 ASCVD risk score is only valid for ages 34 to 69    Assessment & Plan:   Problem List Items Addressed This Visit       Nervous and Auditory   Chronic left-sided low back pain with left-sided sciatica    Patient needs refill on his gabapentin.  I thought I did  send in 37-monthsupply but it was an 267-monthupply.  I do see patient every 6 months for this.  Refill sent to pharmacy      Relevant Medications   gabapentin (NEURONTIN) 600 MG tablet     Other   Ganglion of right wrist    Likely ganglion cyst.  Patient states sometimes his thumb will get stuck.  Sounds like a trigger finger with tendon being trapped will get x-ray of right wrist today.  If no bony abnormalities can consider referral to hand for evaluation of ganglion cyst drainage and/or removal.      Relevant Orders   DG Wrist Complete Right   Right wrist pain - Primary    Secondary to ganglion cyst.  Patient states with certain movements and striking cyst causes pain.  No injury per patient report.  Pending x-ray      Relevant Orders   DG Wrist Complete Right    Return in about 3 months (around 04/11/2022) for CPE.    MaRomilda GarretNP

## 2022-01-09 NOTE — Patient Instructions (Addendum)
Nice to see you today Sent in the gabapentin with 5 refills I will be in touch with the xray results once I get them Follow up in 3-4 months for your next physical

## 2022-01-09 NOTE — Assessment & Plan Note (Signed)
Patient needs refill on his gabapentin.  I thought I did send in 96-monthsupply but it was an 265-monthupply.  I do see patient every 6 months for this.  Refill sent to pharmacy

## 2022-04-11 ENCOUNTER — Other Ambulatory Visit (HOSPITAL_COMMUNITY)
Admission: RE | Admit: 2022-04-11 | Discharge: 2022-04-11 | Disposition: A | Discharge status: Home / Self Care | Payer: 59 | Source: Ambulatory Visit | Attending: Nurse Practitioner | Admitting: Nurse Practitioner

## 2022-04-11 ENCOUNTER — Ambulatory Visit: Payer: 59 | Admitting: Nurse Practitioner

## 2022-04-11 ENCOUNTER — Encounter: Payer: Self-pay | Admitting: Nurse Practitioner

## 2022-04-11 VITALS — BP 128/82 | HR 88 | Temp 96.6°F | Resp 14 | Ht 70.25 in | Wt 165.1 lb

## 2022-04-11 DIAGNOSIS — Z0000 Encounter for general adult medical examination without abnormal findings: Secondary | ICD-10-CM

## 2022-04-11 DIAGNOSIS — Z113 Encounter for screening for infections with a predominantly sexual mode of transmission: Secondary | ICD-10-CM | POA: Insufficient documentation

## 2022-04-11 DIAGNOSIS — M5442 Lumbago with sciatica, left side: Secondary | ICD-10-CM | POA: Diagnosis not present

## 2022-04-11 DIAGNOSIS — Z23 Encounter for immunization: Secondary | ICD-10-CM | POA: Diagnosis not present

## 2022-04-11 DIAGNOSIS — G8929 Other chronic pain: Secondary | ICD-10-CM

## 2022-04-11 DIAGNOSIS — F988 Other specified behavioral and emotional disorders with onset usually occurring in childhood and adolescence: Secondary | ICD-10-CM

## 2022-04-11 LAB — CBC
HCT: 39.5 % (ref 39.0–52.0)
Hemoglobin: 13.2 g/dL (ref 13.0–17.0)
MCHC: 33.4 g/dL (ref 30.0–36.0)
MCV: 92.6 fl (ref 78.0–100.0)
Platelets: 264 10*3/uL (ref 150.0–400.0)
RBC: 4.27 Mil/uL (ref 4.22–5.81)
RDW: 12.9 % (ref 11.5–15.5)
WBC: 5.5 10*3/uL (ref 4.0–10.5)

## 2022-04-11 LAB — LIPID PANEL
Cholesterol: 156 mg/dL (ref 0–200)
HDL: 72.3 mg/dL (ref >39.00)
LDL Cholesterol: 74 mg/dL (ref 0–99)
NonHDL: 84.16
Total CHOL/HDL Ratio: 2
Triglycerides: 49 mg/dL (ref 0.0–149.0)
VLDL: 9.8 mg/dL (ref 0.0–40.0)

## 2022-04-11 LAB — COMPREHENSIVE METABOLIC PANEL
ALT: 12 U/L (ref 0–53)
AST: 16 U/L (ref 0–37)
Albumin: 4.6 g/dL (ref 3.5–5.2)
Alkaline Phosphatase: 57 U/L (ref 39–117)
BUN: 10 mg/dL (ref 6–23)
CO2: 31 mEq/L (ref 19–32)
Calcium: 9.7 mg/dL (ref 8.4–10.5)
Chloride: 102 mEq/L (ref 96–112)
Creatinine, Ser: 0.89 mg/dL (ref 0.40–1.50)
GFR: 110.43 mL/min (ref >60.00)
Glucose, Bld: 86 mg/dL (ref 70–99)
Potassium: 4.5 mEq/L (ref 3.5–5.1)
Sodium: 141 mEq/L (ref 135–145)
Total Bilirubin: 0.7 mg/dL (ref 0.2–1.2)
Total Protein: 6.8 g/dL (ref 6.0–8.3)

## 2022-04-11 LAB — TSH: TSH: 1.51 u[IU]/mL (ref 0.35–5.50)

## 2022-04-11 NOTE — Progress Notes (Signed)
Established Patient Office Visit  Subjective   Patient ID: Darryl Crawford, male    DOB: 1986-02-04  Age: 36 y.o. MRN: [id number]  Chief Complaint  Patient presents with   Annual Exam    HPI  ADD: every 3 months with his behavioral health provider.  Currently maintained on Vyvanse  Gabapentin: for nerve pain and back tolerates well.  Stable on current regimen  for complete physical and follow up of chronic conditions.  Immunizations: -Tetanus: up to date -Influenza: up today -Covid-19: moderna x2 -Shingles: Too young -Pneumonia: Too young  -HPV:  Diet: Fair diet. States that he will do a protien drink for lunch with the hot weather. Will eat three meals a day otherwise. States he will drink coffee in the am and water throughout the day. 2-3 diet mtn dews a day Exercise: No regular exercise. Has been trying to do core training. 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  We will start screening patient age 32 Sleep: states he goes to bed 930-10 and get up around 450. Feels rested.    Tobacco use: states that he is smoking currently. States that he is having 2-3 cigs a day      Review of Systems  Constitutional:  Negative for chills and fever.  Respiratory:  Negative for shortness of breath.   Cardiovascular:  Negative for chest pain and leg swelling.  Gastrointestinal:  Negative for abdominal pain, blood in stool, constipation, diarrhea, nausea and vomiting.       Daily BM  Genitourinary:  Negative for dysuria and hematuria.  Neurological:  Negative for headaches.  Psychiatric/Behavioral:  Negative for hallucinations and suicidal ideas.       Objective:     BP 128/82   Pulse 88   Temp (!) 96.6 F (35.9 C)   Resp 14   Ht 5' 10.25"  (1.784 m)   Wt 165 lb 2 oz (74.9 kg)   SpO2 99%   BMI 23.52 kg/m    Physical Exam Vitals and nursing note reviewed. Exam conducted with a chaperone present Lonell Grandchild, CMA).  Constitutional:      Appearance: Normal appearance.  HENT:     Right Ear: Tympanic membrane, ear canal and external ear normal.     Left Ear: Tympanic membrane, ear canal and external ear normal.     Mouth/Throat:     Mouth: Mucous membranes are moist.     Pharynx: Oropharynx is clear.  Eyes:     Extraocular Movements: Extraocular movements intact.     Pupils: Pupils are equal, round, and reactive to light.  Cardiovascular:     Rate and Rhythm: Normal rate and regular rhythm.     Pulses: Normal pulses.     Heart sounds: Normal heart sounds.  Pulmonary:     Effort: Pulmonary effort is normal.     Breath sounds: Normal breath sounds.  Abdominal:     General: Bowel sounds are normal. There is no distension.     Palpations: There is no mass.     Tenderness: There is no abdominal tenderness.     Hernia: A hernia is present. Hernia is present in the right inguinal area. There is no hernia in the left inguinal area.  Genitourinary:  Penis: Lesions present.      Testes: Normal.     Epididymis:     Right: Normal.     Left: Normal.       Comments: Scrotal   Lesions were nonvesicular using and nontender to palpation. Musculoskeletal:     Right lower leg: No edema.     Left lower leg: No edema.  Lymphadenopathy:     Cervical: No cervical adenopathy.     Lower Body: No right inguinal adenopathy. No left inguinal adenopathy.  Skin:    General: Skin is warm.  Neurological:     General: No focal deficit present.     Mental Status: He is alert.     Comments: Bilateral upper and lower extremity strength 5/5  Psychiatric:        Mood and Affect: Mood normal.        Behavior: Behavior normal.        Thought Content: Thought content normal.        Judgment: Judgment normal.      No results  found for any visits on 04/11/22.    The ASCVD Risk score (Arnett DK, et al., 2019) failed to calculate for the following reasons:   The 2019 ASCVD risk score is only valid for ages 74 to 34    Assessment & Plan:   Problem List Items Addressed This Visit       Nervous and Auditory   Chronic left-sided low back pain with left-sided sciatica    Patient currently maintained on gabapentin.  Tolerating medication well.  Stable.  Continue taking medication as prescribed      Relevant Medications   VYVANSE 50 MG capsule     Other   ADD (attention deficit disorder)    Patient currently being managed by [facility] helper.  Currently on Vyvanse.  Continue take medication as prescribed continue following up with behavioral health clinician as recommended      Preventative health care    Discussed age-appropriate immunizations and screening exams in office.  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  No official diagnosis for healthcare provider.  Lesions were healing on exam today pending blood work if antibodies come back positive we will give patient valacyclovir for treatment.      Relevant Orders   RPR   HSV(herpes simplex vrs) 1+2 ab-IgG   HIV Antibody (routine testing w rflx)   Urine cytology ancillary only   Other Visit Diagnoses     Need for Tdap vaccination    -  Primary   Relevant Orders   Tdap vaccine greater than or equal to 7yo IM   Need for influenza vaccination       Relevant Orders   Flu Vaccine QUAD 6+ mos PF IM (Fluarix Quad PF)       Return in about 1 year (around 04/12/2023) for CPE and Labs.    Darryl Garret, NP

## 2022-04-11 NOTE — Assessment & Plan Note (Signed)
Patient feels like he has herpes and has had for many years.  No official diagnosis for healthcare provider.  Lesions were healing on exam today pending blood work if antibodies come back positive we will give patient valacyclovir for treatment.

## 2022-04-11 NOTE — Assessment & Plan Note (Signed)
Patient currently being managed by [facility] helper.  Currently on Vyvanse.  Continue take medication as prescribed continue following up with behavioral health clinician as recommended

## 2022-04-11 NOTE — Patient Instructions (Signed)
Nice to see you today I will be in touch with the labs once I have the result Follow up with me in 1 year.  Make an appointment with Dr. Lorelei Pont about your shoulder

## 2022-04-11 NOTE — Assessment & Plan Note (Signed)
Patient currently maintained on gabapentin.  Tolerating medication well.  Stable.  Continue taking medication as prescribed

## 2022-04-11 NOTE — Assessment & Plan Note (Signed)
Discussed age-appropriate immunizations and screening exams in office.  Appropriate orders placed today

## 2022-04-12 LAB — RPR: RPR Ser Ql: NONREACTIVE

## 2022-04-12 LAB — URINE CYTOLOGY ANCILLARY ONLY
Chlamydia: NEGATIVE
Comment: NEGATIVE
Comment: NEGATIVE
Comment: NORMAL
Neisseria Gonorrhea: NEGATIVE
Trichomonas: NEGATIVE

## 2022-04-12 LAB — HIV ANTIBODY (ROUTINE TESTING W REFLEX): HIV 1&2 Ab, 4th Generation: NONREACTIVE

## 2022-04-12 LAB — HSV(HERPES SIMPLEX VRS) I + II AB-IGG
HAV 1 IGG,TYPE SPECIFIC AB: <0.9 index
HSV 2 IGG,TYPE SPECIFIC AB: 10.3 index — ABNORMAL HIGH

## 2022-06-25 ENCOUNTER — Other Ambulatory Visit: Payer: Self-pay | Admitting: Nurse Practitioner

## 2022-06-25 ENCOUNTER — Encounter: Payer: Self-pay | Admitting: Nurse Practitioner

## 2022-06-25 DIAGNOSIS — G8929 Other chronic pain: Secondary | ICD-10-CM

## 2022-06-26 ENCOUNTER — Encounter: Payer: Self-pay | Admitting: Nurse Practitioner

## 2022-06-26 NOTE — Telephone Encounter (Signed)
Please review refill request in the separate refill request note from the pharmacy. Thank you!

## 2022-07-15 ENCOUNTER — Other Ambulatory Visit (HOSPITAL_COMMUNITY): Payer: Self-pay

## 2022-07-15 MED ORDER — LISDEXAMFETAMINE DIMESYLATE 70 MG PO CAPS
70.0000 mg | ORAL_CAPSULE | Freq: Every day | ORAL | 0 refills | Status: DC
  Filled 2022-07-15: qty 30, 30d supply, fill #0

## 2022-07-17 ENCOUNTER — Ambulatory Visit: Payer: 59 | Admitting: Nurse Practitioner

## 2022-07-26 IMAGING — DX DG WRIST COMPLETE 3+V*R*
3 series · 3 of 3 positions shown · non-contrast
Comparison: Right wrist radiographs 07/13/2014

CLINICAL DATA: Right lateral wrist pain. Suspect cyst. No known
injury. History of right hand surgery.

EXAM:
RIGHT WRIST - COMPLETE 3+ VIEW

[wrist pa]
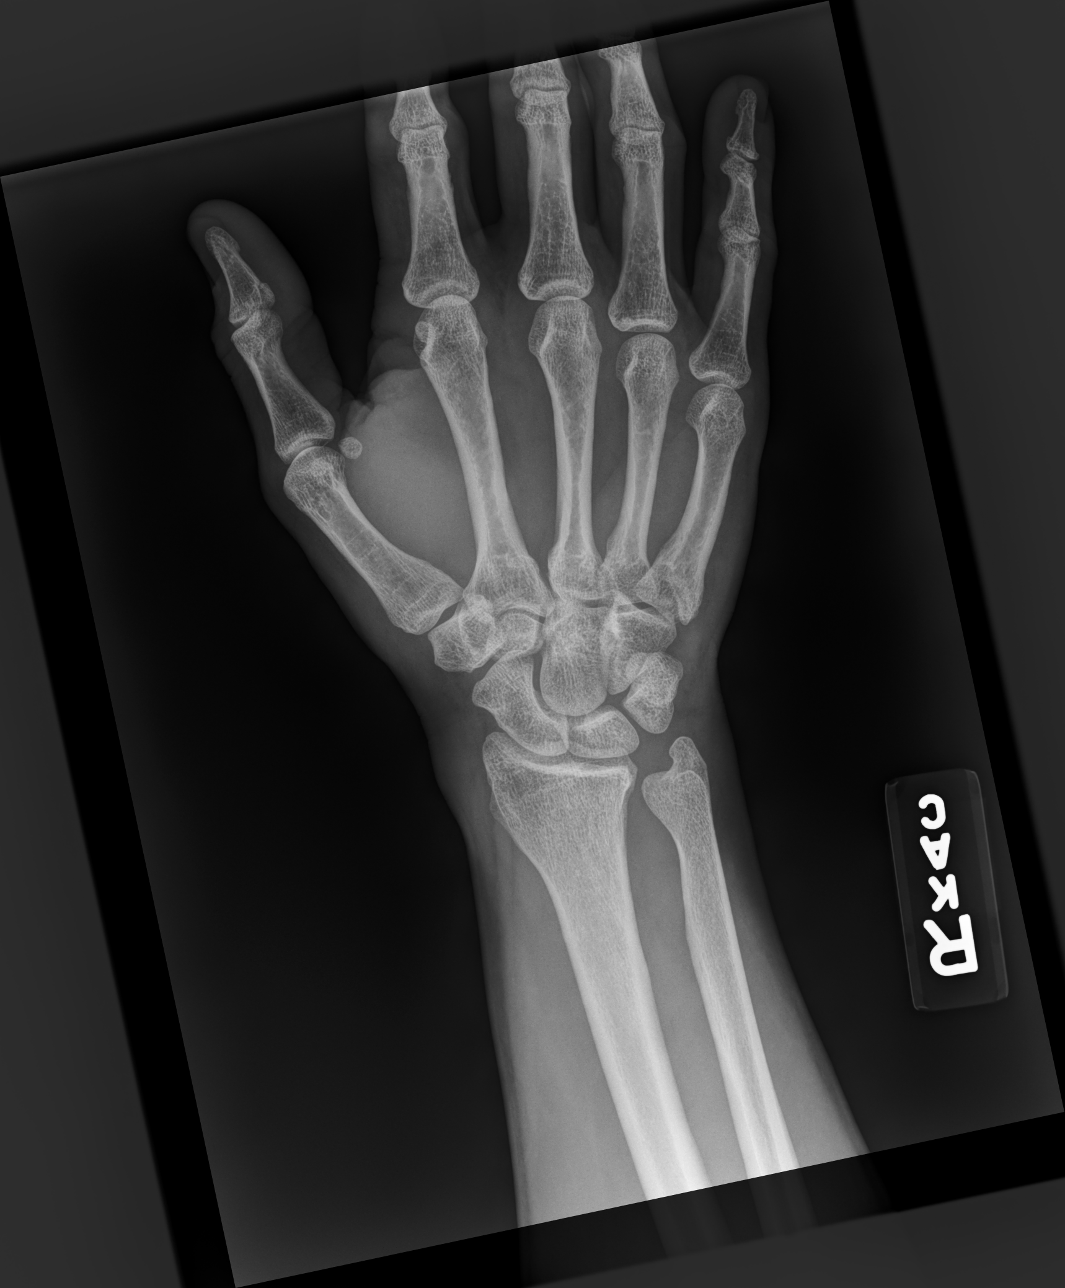

[wrist mlo]
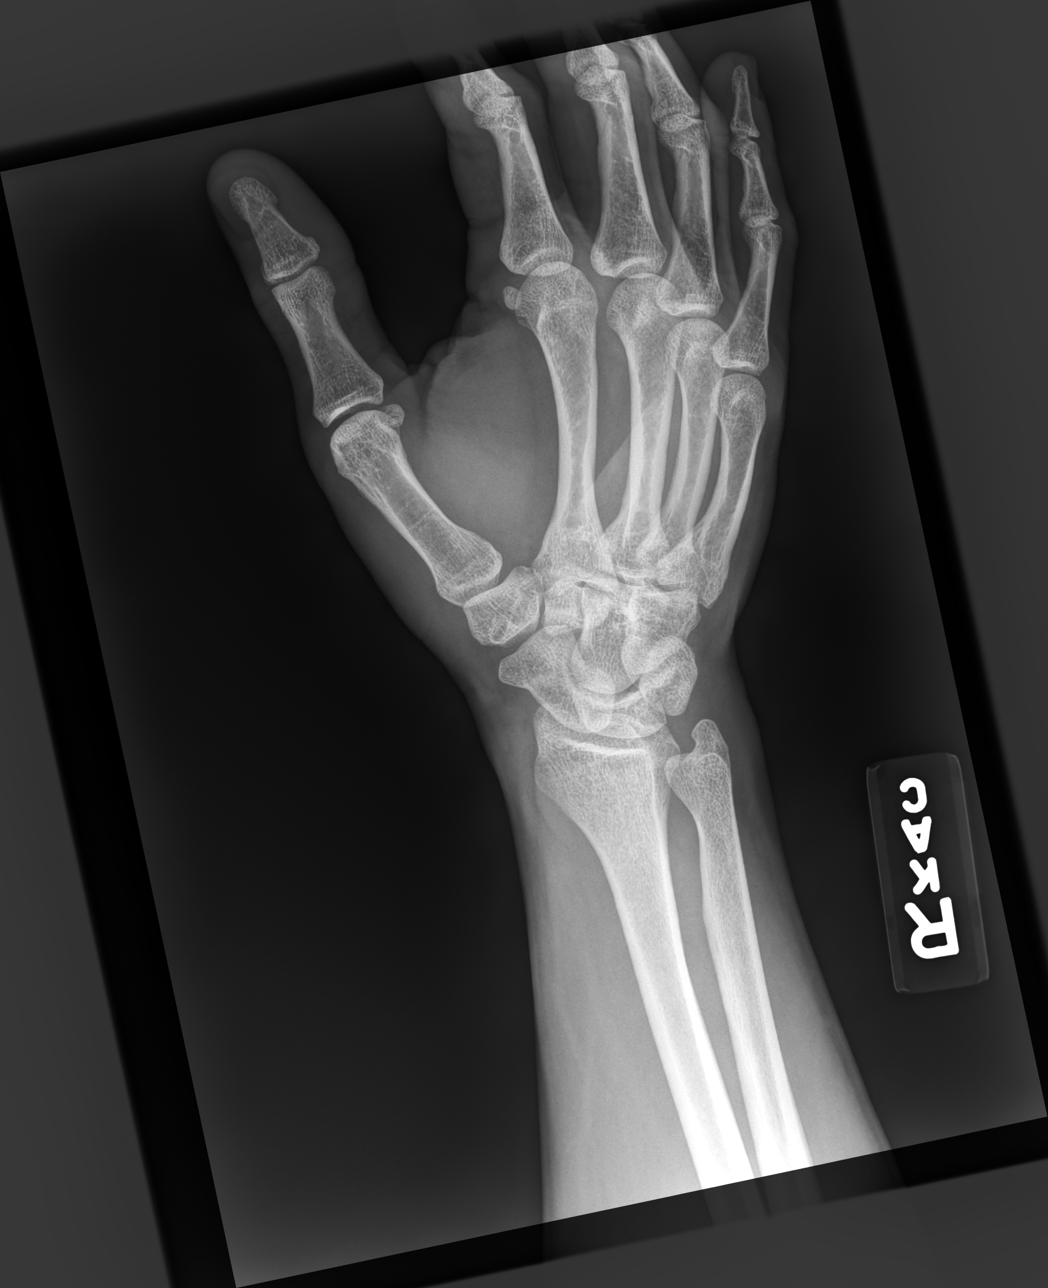

[wrist lat]
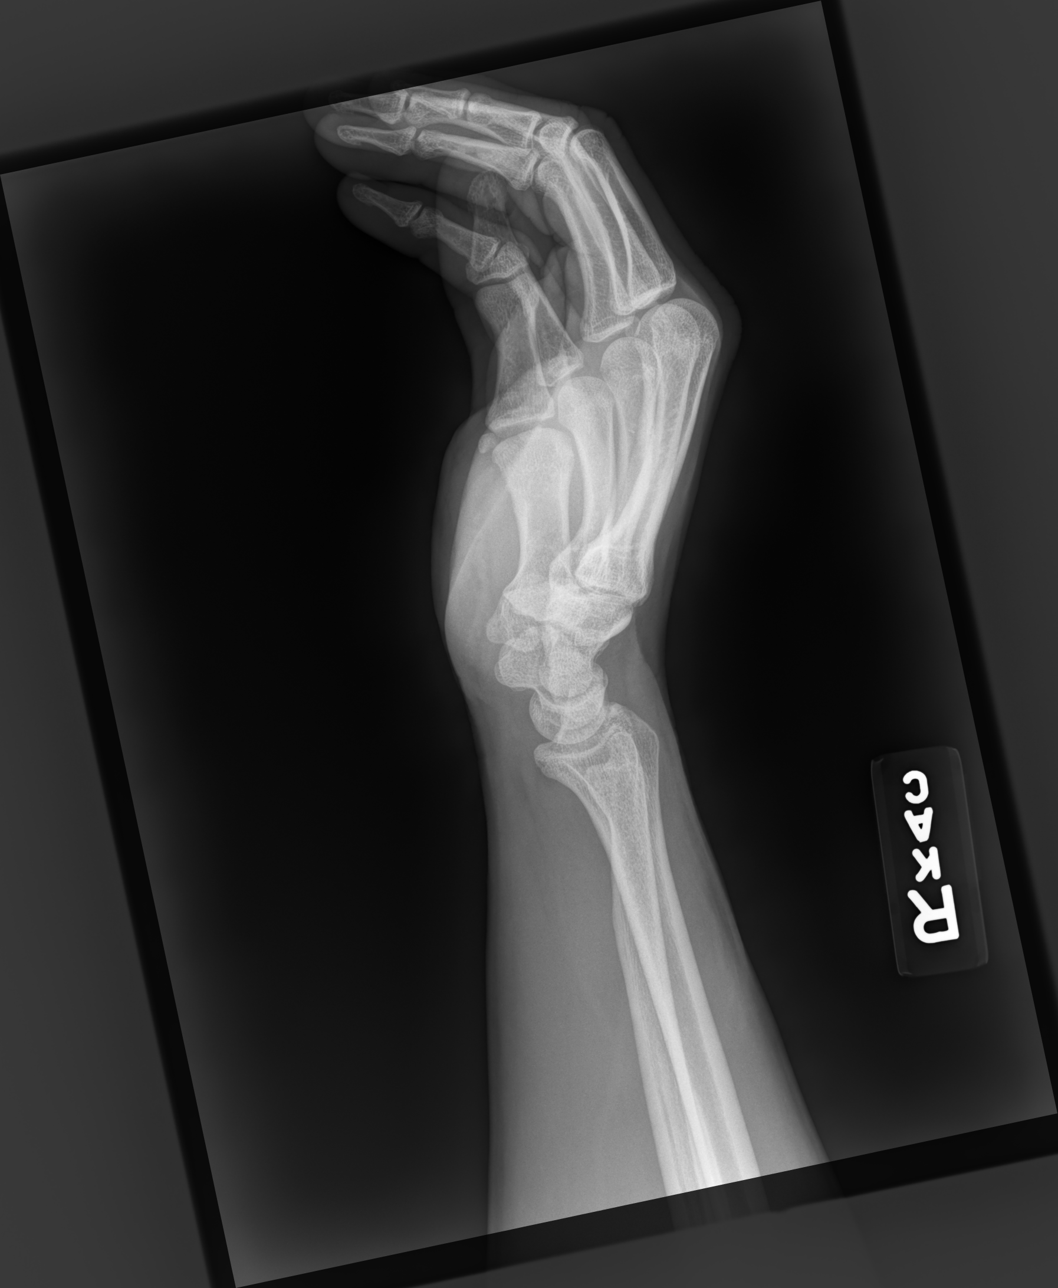

[3 of 3 positions shown; findings below may reference images not displayed]

FINDINGS: Interval complete healing of the prior subacute base of fifth
metacarpal fracture. 3 mm ulnar negative variance is again seen
likely developmental. Joint spaces are preserved. No acute fracture
or dislocation.

Mild convex contour of the radial wrist soft tissue margin at the
level of the distal radial styloid may correspond to the suspected
clinical cyst. Recommend clinical correlation.
IMPRESSION: 1. Interval healing of fracture of the base of fifth metacarpal seen
on prior remote 7328 radiographs.
2. Joint spaces are preserved. Mild convex contour of the radial
wrist soft tissue margin may correspond to the suspected clinical
cyst. Recommend clinical correlation.

## 2022-08-15 ENCOUNTER — Other Ambulatory Visit (HOSPITAL_COMMUNITY): Payer: Self-pay

## 2022-08-15 ENCOUNTER — Other Ambulatory Visit (HOSPITAL_BASED_OUTPATIENT_CLINIC_OR_DEPARTMENT_OTHER): Payer: Self-pay

## 2022-08-15 MED ORDER — LISDEXAMFETAMINE DIMESYLATE 70 MG PO CAPS
70.0000 mg | ORAL_CAPSULE | Freq: Every day | ORAL | 0 refills | Status: DC
  Filled 2022-08-15 (×2): qty 30, 30d supply, fill #0

## 2022-08-15 MED ORDER — LISDEXAMFETAMINE DIMESYLATE 70 MG PO CAPS
70.0000 mg | ORAL_CAPSULE | Freq: Every morning | ORAL | 0 refills | Status: DC
  Filled 2022-08-15: qty 30, 30d supply, fill #0

## 2022-08-16 ENCOUNTER — Other Ambulatory Visit (HOSPITAL_BASED_OUTPATIENT_CLINIC_OR_DEPARTMENT_OTHER): Payer: Self-pay

## 2022-09-23 ENCOUNTER — Encounter: Payer: Self-pay | Admitting: Nurse Practitioner

## 2022-10-15 ENCOUNTER — Encounter: Payer: Self-pay | Admitting: Nurse Practitioner

## 2022-10-16 ENCOUNTER — Other Ambulatory Visit (HOSPITAL_COMMUNITY): Payer: Self-pay

## 2022-10-16 MED ORDER — LISDEXAMFETAMINE DIMESYLATE 70 MG PO CAPS
70.0000 mg | ORAL_CAPSULE | Freq: Every day | ORAL | 0 refills | Status: DC
  Filled 2022-10-16: qty 30, 30d supply, fill #0

## 2022-11-16 ENCOUNTER — Other Ambulatory Visit (HOSPITAL_COMMUNITY): Payer: Self-pay

## 2022-11-16 MED ORDER — LISDEXAMFETAMINE DIMESYLATE 70 MG PO CAPS
ORAL_CAPSULE | ORAL | 0 refills | Status: DC
  Filled 2022-11-16: qty 30, 30d supply, fill #0

## 2022-11-18 ENCOUNTER — Other Ambulatory Visit (HOSPITAL_COMMUNITY): Payer: Self-pay

## 2022-11-18 MED ORDER — LISDEXAMFETAMINE DIMESYLATE 70 MG PO CAPS
70.0000 mg | ORAL_CAPSULE | Freq: Every day | ORAL | 0 refills | Status: DC
  Filled 2022-11-18: qty 30, 30d supply, fill #0

## 2022-12-02 ENCOUNTER — Encounter: Payer: Self-pay | Admitting: Nurse Practitioner

## 2022-12-02 DIAGNOSIS — G8929 Other chronic pain: Secondary | ICD-10-CM

## 2022-12-03 NOTE — Telephone Encounter (Signed)
Can we call the pharmacy and see if he has refills on the script I sent in? If not send me the note back and I will refill

## 2022-12-04 MED ORDER — GABAPENTIN 600 MG PO TABS: 600.0000 mg | ORAL_TABLET | Freq: Three times a day (TID) | ORAL | 4 refills | Status: DC

## 2022-12-04 NOTE — Telephone Encounter (Signed)
Called pharmacy and they stated that he did not have any refills on file.

## 2022-12-19 ENCOUNTER — Other Ambulatory Visit: Payer: Self-pay

## 2022-12-19 ENCOUNTER — Other Ambulatory Visit (HOSPITAL_COMMUNITY): Payer: Self-pay

## 2022-12-19 MED ORDER — LISDEXAMFETAMINE DIMESYLATE 70 MG PO CAPS
ORAL_CAPSULE | ORAL | 0 refills | Status: DC
  Filled 2022-12-19: qty 30, 30d supply, fill #0

## 2023-01-17 ENCOUNTER — Other Ambulatory Visit (HOSPITAL_COMMUNITY): Payer: Self-pay

## 2023-01-17 MED ORDER — LISDEXAMFETAMINE DIMESYLATE 70 MG PO CAPS
70.0000 mg | ORAL_CAPSULE | Freq: Every day | ORAL | 0 refills | Status: DC
  Filled 2023-01-17: qty 30, 30d supply, fill #0

## 2023-01-18 ENCOUNTER — Other Ambulatory Visit (HOSPITAL_COMMUNITY): Payer: Self-pay

## 2023-01-20 ENCOUNTER — Other Ambulatory Visit (HOSPITAL_COMMUNITY): Payer: Self-pay

## 2023-02-03 ENCOUNTER — Ambulatory Visit (INDEPENDENT_AMBULATORY_CARE_PROVIDER_SITE_OTHER)
Admission: RE | Admit: 2023-02-03 | Discharge: 2023-02-03 | Disposition: A | Discharge status: Home / Self Care | Payer: 59 | Source: Ambulatory Visit | Attending: Family Medicine | Admitting: Family Medicine

## 2023-02-03 ENCOUNTER — Encounter: Payer: Self-pay | Admitting: Family Medicine

## 2023-02-03 ENCOUNTER — Ambulatory Visit: Payer: 59 | Admitting: Family Medicine

## 2023-02-03 VITALS — BP 118/82 | HR 91 | Temp 97.5°F | Ht 70.25 in | Wt 158.4 lb

## 2023-02-03 DIAGNOSIS — M25562 Pain in left knee: Secondary | ICD-10-CM

## 2023-02-03 LAB — CBC WITH DIFFERENTIAL/PLATELET
Basophils Absolute: 0.1 10*3/uL (ref 0.0–0.1)
Basophils Relative: 0.7 % (ref 0.0–3.0)
Eosinophils Absolute: 0.2 10*3/uL (ref 0.0–0.7)
Eosinophils Relative: 3 % (ref 0.0–5.0)
HCT: 41.7 % (ref 39.0–52.0)
Hemoglobin: 13.6 g/dL (ref 13.0–17.0)
Lymphocytes Relative: 28.6 % (ref 12.0–46.0)
Lymphs Abs: 2.2 10*3/uL (ref 0.7–4.0)
MCHC: 32.6 g/dL (ref 30.0–36.0)
MCV: 93.5 fl (ref 78.0–100.0)
Monocytes Absolute: 0.9 10*3/uL (ref 0.1–1.0)
Monocytes Relative: 12.5 % — ABNORMAL HIGH (ref 3.0–12.0)
Neutro Abs: 4.2 10*3/uL (ref 1.4–7.7)
Neutrophils Relative %: 55.2 % (ref 43.0–77.0)
Platelets: 299 10*3/uL (ref 150.0–400.0)
RBC: 4.46 Mil/uL (ref 4.22–5.81)
RDW: 13 % (ref 11.5–15.5)
WBC: 7.5 10*3/uL (ref 4.0–10.5)

## 2023-02-03 LAB — URIC ACID: Uric Acid, Serum: 3.7 mg/dL — ABNORMAL LOW (ref 4.0–7.8)

## 2023-02-03 LAB — SEDIMENTATION RATE: Sed Rate: 8 mm/hr (ref 0–15)

## 2023-02-03 MED ORDER — NAPROXEN 500 MG PO TABS: 500.0000 mg | ORAL_TABLET | Freq: Two times a day (BID) | ORAL | 0 refills | Status: AC

## 2023-02-03 NOTE — Assessment & Plan Note (Signed)
Suspect acute non-infectious prepatellar bursitis given marked improvement in 1-2 days with NSAID, ice, off abx.  Check labs today for uric acid (r/o gout), CBC (r/o infection), ESR. Rx naprosyn 500mg  BID with meals x 5 days then PRN.  In h/o bilateral osgood schlatter, update L knee film, discussed ortho eval if ongoing tender bony prominences to consider excision.

## 2023-02-03 NOTE — Patient Instructions (Signed)
Likely inflammatory bursitis of anterior knee.  Treat with naprosyn 500mg  twice daily with meals for 5-7 days then as needed.  Labs to check for gout/infection and xray today to further evaluate history of osgood schlatter disease.  Let us know if fever or any recurrent swelling like last week, or new draining.

## 2023-02-03 NOTE — Progress Notes (Signed)
Ph: [phone] Fax: [phone]   Patient ID: Darryl Crawford, male    DOB: 22-Jun-1986, 37 y.o.   MRN: [id number]  This visit was conducted in person.  BP 118/82   Pulse 91   Temp (!) 97.5 F (36.4 C) (Temporal)   Ht 5' 10.25" (1.784 m)   Wt 158 lb 6 oz (71.8 kg)   SpO2 99%   BMI 22.56 kg/m    CC: L knee pain  Subjective:   HPI: Darryl Crawford is a 37 y.o. male presenting on 02/03/2023 for Knee Pain (C/o knot on L knee causing pain/swelling- worsened on 01/30/23. H/o Osgood-Schlatter dz. )   4d h/o acute on chronic worsening of L anterior knee pain after kayaking and working on [employer], kneeling (01/30/2023). Had marked pain/swelling to left anterior knee to area below patellar tendon, red and hot as well. Swelling was pronounced to anterior knee. Swelling affected ambulation. Denies other inciting trauma/injury or fall.   He treated with ibuprofen 800mg  TID AC with benefit as well as ice.  Still hurts with full flexion/extension, but significant improvement since 4d ago.  Currently 1-2/10, at its worse 7/10.   No h/o gout.  No recent increase in red meat (did have some hamburgers), shellfish. No alcohol at all.   Chronic swelling to anterior knees since adolescent - he has been Mining engineer, Advice worker. Told had osgood schlatter disease as child.   No h/o kidney disease or GI disease.  H/o ADHD on vyvanse.      Relevant past medical, surgical, family and social history reviewed and updated as indicated. Interim medical history since our last visit reviewed. Allergies and medications reviewed and updated. Outpatient Medications Prior to Visit  Medication Sig Dispense Refill   fluticasone (FLONASE) 50 MCG/ACT nasal spray Place 2 sprays into both nostrils daily. (Patient taking differently: Place 2 sprays into both nostrils daily. As needed) 16 g 0   gabapentin (NEURONTIN) 600 MG tablet Take 1 tablet (600 mg total) by mouth 3 (three) times daily. 90 tablet 4    lisdexamfetamine (VYVANSE) 70 MG capsule 1 capsule by mouth daily 30 capsule 0   lisdexamfetamine (VYVANSE) 70 MG capsule Take 1 capsule (70 mg total) by mouth daily. 30 capsule 0   lisdexamfetamine (VYVANSE) 70 MG capsule Take 1 capsule by mouth daily 30 capsule 0   lisdexamfetamine (VYVANSE) 70 MG capsule Take 1 capsule (70 mg total) by mouth daily. 30 capsule 0   NAPROXEN PO Take 1-2 tablets by mouth daily.     VYVANSE 50 MG capsule Take 50 mg by mouth daily.     No facility-administered medications prior to visit.     Per HPI unless specifically indicated in ROS section below Review of Systems  Objective:  BP 118/82   Pulse 91   Temp (!) 97.5 F (36.4 C) (Temporal)   Ht 5' 10.25" (1.784 m)   Wt 158 lb 6 oz (71.8 kg)   SpO2 99%   BMI 22.56 kg/m   Wt Readings from Last 3 Encounters:  02/03/23 158 lb 6 oz (71.8 kg)  04/11/22 165 lb 2 oz (74.9 kg)  01/09/22 164 lb 6 oz (74.6 kg)      Physical Exam Vitals and nursing note reviewed.  Constitutional:      Appearance: Normal appearance. He is not ill-appearing.  Musculoskeletal:        General: Swelling and tenderness present.     Right lower leg: No edema.  Left lower leg: No edema.       Legs:     Comments:  FROM bilateral knees No popliteal fullness R knee with chronic tender swelling at tibial tubercle without erythema/warmth L knee with acute on chronic tender swelling to tibial tubercle inferior to patellar tendon with erythema/mild warmth, no fluctuance   Skin:    General: Skin is warm and dry.     Findings: Erythema (mild to anterior L knee below patellar tendon) present. No rash.  Neurological:     Mental Status: He is alert.  Psychiatric:        Mood and Affect: Mood normal.        Behavior: Behavior normal.        Lab Results  Component Value Date   CREATININE 0.89 04/11/2022   BUN 10 04/11/2022   NA 141 04/11/2022   K 4.5 04/11/2022   CL 102 04/11/2022   CO2 31 04/11/2022    Assessment &  Plan:   Problem List Items Addressed This Visit     Left anterior knee pain - Primary    Suspect acute non-infectious prepatellar bursitis given marked improvement in 1-2 days with NSAID, ice, off abx.  Check labs today for uric acid (r/o gout), CBC (r/o infection), ESR. Rx naprosyn 500mg  BID with meals x 5 days then PRN.  In h/o bilateral osgood schlatter, update L knee film, discussed ortho eval if ongoing tender bony prominences to consider excision.       Relevant Orders   DG Knee Complete 4 Views Left   CBC with Differential/Platelet   Uric acid   Sedimentation rate     Meds ordered this encounter  Medications   naproxen (NAPROSYN) 500 MG tablet    Sig: Take 1 tablet (500 mg total) by mouth 2 (two) times daily with a meal. For 5 days then as needed    Dispense:  30 tablet    Refill:  0    Orders Placed This Encounter  Procedures   DG Knee Complete 4 Views Left    Standing Status:   Future    Number of Occurrences:   1    Standing Expiration Date:   02/03/2024    Order Specific Question:   Reason for Exam (SYMPTOM  OR DIAGNOSIS REQUIRED)    Answer:   left anterior knee pain and swelling at prepatellar bursa, h/o osgood schlatter disease    Order Specific Question:   Preferred imaging location?    Answer:   [facility]   CBC with Differential/Platelet   Uric acid   Sedimentation rate    Patient Instructions  Likely inflammatory bursitis of anterior knee.  Treat with naprosyn 500mg  twice daily with meals for 5-7 days then as needed.  Labs to check for gout/infection and xray today to further evaluate history of osgood schlatter disease.  Let us know if fever or any recurrent swelling like last week, or new draining.   Follow up plan: Return if symptoms worsen or fail to improve.  Eustaquio Boyden, MD

## 2023-02-07 ENCOUNTER — Encounter: Payer: Self-pay | Admitting: Family Medicine

## 2023-02-10 NOTE — Telephone Encounter (Signed)
Replied via result note.

## 2023-02-18 ENCOUNTER — Other Ambulatory Visit (HOSPITAL_COMMUNITY): Payer: Self-pay

## 2023-02-18 MED ORDER — LISDEXAMFETAMINE DIMESYLATE 70 MG PO CAPS
ORAL_CAPSULE | ORAL | 0 refills | Status: DC
  Filled 2023-02-18: qty 30, 30d supply, fill #0

## 2023-03-20 ENCOUNTER — Other Ambulatory Visit: Payer: Self-pay

## 2023-03-20 ENCOUNTER — Other Ambulatory Visit (HOSPITAL_COMMUNITY): Payer: Self-pay

## 2023-03-20 MED ORDER — LISDEXAMFETAMINE DIMESYLATE 70 MG PO CAPS
70.0000 mg | ORAL_CAPSULE | Freq: Every day | ORAL | 0 refills | Status: DC
  Filled 2023-03-20: qty 30, 30d supply, fill #0

## 2023-04-18 ENCOUNTER — Other Ambulatory Visit (HOSPITAL_COMMUNITY): Payer: Self-pay

## 2023-04-18 MED ORDER — LISDEXAMFETAMINE DIMESYLATE 70 MG PO CAPS
70.0000 mg | ORAL_CAPSULE | Freq: Every day | ORAL | 0 refills | Status: DC
  Filled 2023-04-18: qty 30, 30d supply, fill #0

## 2023-05-05 ENCOUNTER — Encounter: Payer: Self-pay | Admitting: Nurse Practitioner

## 2023-05-05 DIAGNOSIS — G8929 Other chronic pain: Secondary | ICD-10-CM

## 2023-05-05 MED ORDER — GABAPENTIN 600 MG PO TABS: 600.0000 mg | ORAL_TABLET | Freq: Three times a day (TID) | ORAL | 4 refills | Status: DC

## 2023-05-05 NOTE — Telephone Encounter (Signed)
LOV - 02/03/23 NOV - 05/08/23 RF - 12/04/22 #90/4

## 2023-05-08 ENCOUNTER — Ambulatory Visit: Payer: 59 | Admitting: Nurse Practitioner

## 2023-05-09 ENCOUNTER — Encounter: Payer: Self-pay | Admitting: Nurse Practitioner

## 2023-05-20 ENCOUNTER — Other Ambulatory Visit: Payer: Self-pay

## 2023-05-20 ENCOUNTER — Other Ambulatory Visit (HOSPITAL_COMMUNITY): Payer: Self-pay

## 2023-05-20 MED ORDER — LISDEXAMFETAMINE DIMESYLATE 70 MG PO CAPS
70.0000 mg | ORAL_CAPSULE | Freq: Every day | ORAL | 0 refills | Status: DC
  Filled 2023-05-20: qty 30, 30d supply, fill #0

## 2023-06-20 ENCOUNTER — Other Ambulatory Visit (HOSPITAL_COMMUNITY): Payer: Self-pay

## 2023-06-20 MED ORDER — LISDEXAMFETAMINE DIMESYLATE 70 MG PO CAPS
70.0000 mg | ORAL_CAPSULE | Freq: Every day | ORAL | 0 refills | Status: DC
  Filled 2023-06-20: qty 30, 30d supply, fill #0

## 2023-07-19 ENCOUNTER — Other Ambulatory Visit (HOSPITAL_COMMUNITY): Payer: Self-pay

## 2023-07-19 MED ORDER — LISDEXAMFETAMINE DIMESYLATE 70 MG PO CAPS
70.0000 mg | ORAL_CAPSULE | Freq: Every day | ORAL | 0 refills | Status: DC
  Filled 2023-07-19: qty 30, 30d supply, fill #0

## 2023-07-21 ENCOUNTER — Other Ambulatory Visit (HOSPITAL_COMMUNITY): Payer: Self-pay

## 2023-08-21 ENCOUNTER — Other Ambulatory Visit (HOSPITAL_COMMUNITY): Payer: Self-pay

## 2023-08-21 MED ORDER — LISDEXAMFETAMINE DIMESYLATE 70 MG PO CAPS
70.0000 mg | ORAL_CAPSULE | Freq: Every day | ORAL | 0 refills | Status: DC
  Filled 2023-08-21: qty 30, 30d supply, fill #0

## 2023-09-19 ENCOUNTER — Other Ambulatory Visit (HOSPITAL_COMMUNITY): Payer: Self-pay

## 2023-09-19 MED ORDER — LISDEXAMFETAMINE DIMESYLATE 70 MG PO CAPS
70.0000 mg | ORAL_CAPSULE | Freq: Every day | ORAL | 0 refills | Status: DC
Start: 2023-09-19 — End: 2023-10-20
  Filled 2023-09-19: qty 30, 30d supply, fill #0

## 2023-09-20 ENCOUNTER — Other Ambulatory Visit (HOSPITAL_COMMUNITY): Payer: Self-pay

## 2023-09-25 ENCOUNTER — Encounter: Payer: Self-pay | Admitting: Nurse Practitioner

## 2023-09-25 DIAGNOSIS — G8929 Other chronic pain: Secondary | ICD-10-CM

## 2023-09-26 ENCOUNTER — Other Ambulatory Visit: Payer: Self-pay

## 2023-09-26 DIAGNOSIS — G8929 Other chronic pain: Secondary | ICD-10-CM

## 2023-09-26 MED ORDER — GABAPENTIN 600 MG PO TABS: 600.0000 mg | ORAL_TABLET | Freq: Three times a day (TID) | ORAL | 0 refills | Status: DC

## 2023-09-26 NOTE — Telephone Encounter (Signed)
 Patient has not been seen in office in over a year. Follow up scheduled for 10/24/23. Ok to send 30 day with no refills as pended.

## 2023-09-28 ENCOUNTER — Encounter: Payer: Self-pay | Admitting: Family Medicine

## 2023-09-28 DIAGNOSIS — G8929 Other chronic pain: Secondary | ICD-10-CM

## 2023-10-01 NOTE — Addendum Note (Signed)
 Addended by: Donnamarie Poag on: 10/01/2023 02:49 PM   Modules accepted: Orders

## 2023-10-01 NOTE — Telephone Encounter (Signed)
 Message sent to patient that he should not have appointment with Dr. Hoy Finlay to refill as requested and pended?

## 2023-10-06 ENCOUNTER — Ambulatory Visit: Admitting: Family Medicine

## 2023-10-06 MED ORDER — GABAPENTIN 600 MG PO TABS
600.0000 mg | ORAL_TABLET | Freq: Three times a day (TID) | ORAL | 0 refills | Status: DC
Start: 2023-10-06 — End: 2023-10-27

## 2023-10-06 NOTE — Addendum Note (Signed)
 Addended by: Eden Emms on: 10/06/2023 04:45 PM   Modules accepted: Orders

## 2023-10-06 NOTE — Telephone Encounter (Signed)
 LAST APPOINTMENT DATE: 02/03/23 with (Dr. Sharen Hones for Left Knee pain.)     04/11/22 with PCP physical exam.       NEXT APPOINTMENT DATE: 10/24/23  Gabapentin 600 mg  LAST REFILL: 09/26/23   QTY: #90 0RF

## 2023-10-20 ENCOUNTER — Other Ambulatory Visit (HOSPITAL_COMMUNITY): Payer: Self-pay

## 2023-10-20 MED ORDER — LISDEXAMFETAMINE DIMESYLATE 70 MG PO CAPS
70.0000 mg | ORAL_CAPSULE | Freq: Every day | ORAL | 0 refills | Status: DC
  Filled 2023-10-20: qty 30, 30d supply, fill #0

## 2023-10-24 ENCOUNTER — Ambulatory Visit: Payer: 59 | Admitting: Nurse Practitioner

## 2023-10-24 ENCOUNTER — Telehealth: Payer: Self-pay | Admitting: Family

## 2023-10-27 ENCOUNTER — Ambulatory Visit: Admitting: Family

## 2023-10-27 ENCOUNTER — Ambulatory Visit (INDEPENDENT_AMBULATORY_CARE_PROVIDER_SITE_OTHER): Admitting: Nurse Practitioner

## 2023-10-27 ENCOUNTER — Encounter: Payer: Self-pay | Admitting: Nurse Practitioner

## 2023-10-27 VITALS — BP 122/82 | HR 81 | Temp 97.7°F | Ht 69.75 in | Wt 163.2 lb

## 2023-10-27 DIAGNOSIS — U071 COVID-19: Secondary | ICD-10-CM

## 2023-10-27 DIAGNOSIS — M5442 Lumbago with sciatica, left side: Secondary | ICD-10-CM | POA: Diagnosis not present

## 2023-10-27 DIAGNOSIS — Z13220 Encounter for screening for lipoid disorders: Secondary | ICD-10-CM | POA: Diagnosis not present

## 2023-10-27 DIAGNOSIS — F909 Attention-deficit hyperactivity disorder, unspecified type: Secondary | ICD-10-CM

## 2023-10-27 DIAGNOSIS — F17200 Nicotine dependence, unspecified, uncomplicated: Secondary | ICD-10-CM | POA: Insufficient documentation

## 2023-10-27 DIAGNOSIS — G8929 Other chronic pain: Secondary | ICD-10-CM

## 2023-10-27 DIAGNOSIS — Z0000 Encounter for general adult medical examination without abnormal findings: Secondary | ICD-10-CM | POA: Diagnosis not present

## 2023-10-27 DIAGNOSIS — L255 Unspecified contact dermatitis due to plants, except food: Secondary | ICD-10-CM | POA: Insufficient documentation

## 2023-10-27 DIAGNOSIS — R0982 Postnasal drip: Secondary | ICD-10-CM

## 2023-10-27 LAB — COMPREHENSIVE METABOLIC PANEL WITH GFR
ALT: 16 U/L (ref 0–53)
AST: 18 U/L (ref 0–37)
Albumin: 4.8 g/dL (ref 3.5–5.2)
Alkaline Phosphatase: 64 U/L (ref 39–117)
BUN: 13 mg/dL (ref 6–23)
CO2: 30 meq/L (ref 19–32)
Calcium: 9.6 mg/dL (ref 8.4–10.5)
Chloride: 101 meq/L (ref 96–112)
Creatinine, Ser: 1.12 mg/dL (ref 0.40–1.50)
GFR: 83.74 mL/min (ref >60.00)
Glucose, Bld: 95 mg/dL (ref 70–99)
Potassium: 4.2 meq/L (ref 3.5–5.1)
Sodium: 138 meq/L (ref 135–145)
Total Bilirubin: 0.5 mg/dL (ref 0.2–1.2)
Total Protein: 7.1 g/dL (ref 6.0–8.3)

## 2023-10-27 LAB — CBC
HCT: 41.4 % (ref 39.0–52.0)
Hemoglobin: 13.8 g/dL (ref 13.0–17.0)
MCHC: 33.3 g/dL (ref 30.0–36.0)
MCV: 93.3 fl (ref 78.0–100.0)
Platelets: 279 10*3/uL (ref 150.0–400.0)
RBC: 4.44 Mil/uL (ref 4.22–5.81)
RDW: 12.9 % (ref 11.5–15.5)
WBC: 6.8 10*3/uL (ref 4.0–10.5)

## 2023-10-27 LAB — LIPID PANEL
Cholesterol: 178 mg/dL (ref 0–200)
HDL: 81.9 mg/dL (ref >39.00)
LDL Cholesterol: 86 mg/dL (ref 0–99)
NonHDL: 96.01
Total CHOL/HDL Ratio: 2
Triglycerides: 51 mg/dL (ref 0.0–149.0)
VLDL: 10.2 mg/dL (ref 0.0–40.0)

## 2023-10-27 LAB — TSH: TSH: 2.52 u[IU]/mL (ref 0.35–5.50)

## 2023-10-27 LAB — URINALYSIS, MICROSCOPIC ONLY
RBC / HPF: NONE SEEN (ref 0–?)
WBC, UA: NONE SEEN (ref 0–?)

## 2023-10-27 MED ORDER — FLUTICASONE PROPIONATE 50 MCG/ACT NA SUSP: 2.0000 | Freq: Every day | NASAL | 2 refills | Status: AC

## 2023-10-27 MED ORDER — GABAPENTIN 600 MG PO TABS: 600.0000 mg | ORAL_TABLET | Freq: Three times a day (TID) | ORAL | 3 refills | Status: DC

## 2023-10-27 MED ORDER — TRIAMCINOLONE ACETONIDE 0.1 % EX CREA: 1.0000 | TOPICAL_CREAM | Freq: Two times a day (BID) | CUTANEOUS | 0 refills | Status: AC

## 2023-10-27 NOTE — Assessment & Plan Note (Signed)
 Refill of Flonase provided today.

## 2023-10-27 NOTE — Progress Notes (Signed)
 Established Patient Office Visit  Subjective   Patient ID: Darryl Crawford, male    DOB: 11/07/1985  Age: 38 y.o. MRN: [id number]  Chief Complaint  Patient presents with   Annual Exam    Discuss prevnar    Medication Refill    Gabapentin and Flonase spray    HPI  for complete physical and follow up of chronic conditions.  Chronic left sided back pain: patien is on gabapentin 600mg  TID. States that he is doing well with the medications.   ADD: followed by Johna Sheriff and managed on vyvanse 70mg  daily   Immunizations: -Tetanus: Completed in 2023 -Influenza: up date today -Shingles: too young  -Pneumonia: too young  Diet: Fair diet. He is eating 3 meals a day sometimes 2. He is drinkg coffee in the am and a soda that is diet. Drinks lots of water Exercise: No regular exercise. He is doing some exercises to help with his shoulder pain. He is active at work   Eye exam: PRN Dental exam: Completes semi-annually    Colonoscopy: too young   Lung Cancer Screening: NA   PSA: too young   Sleep: going to bed around 10 and will get up around 630-7. Feels rested  Rash: states that he was moving dirt and planting some plants. States that it was Friday. He started itching and then it came up. States that it came up  He has used calimaine on it and has helped some A gel to get the oil out. It seemed to help. It is on bilateral arms and anterior thighs       Review of Systems  Constitutional:  Negative for chills and fever.  Respiratory:  Negative for shortness of breath.   Cardiovascular:  Negative for chest pain and leg swelling.  Gastrointestinal:  Negative for abdominal pain, blood in stool, constipation, diarrhea, nausea and vomiting.       Bm daily   Genitourinary:  Negative for dysuria and hematuria.  Musculoskeletal:  Positive for back pain.  Skin:  Positive for itching and rash.  Neurological:  Positive for tingling. Negative for headaches.  Psychiatric/Behavioral:   Negative for hallucinations and suicidal ideas.       Objective:     BP 122/82   Pulse 81   Temp 97.7 F (36.5 C) (Oral)   Ht 5' 9.75" (1.772 m)   Wt 163 lb 3.2 oz (74 kg)   SpO2 99%   BMI 23.58 kg/m  BP Readings from Last 3 Encounters:  10/27/23 122/82  02/03/23 118/82  04/11/22 128/82   Wt Readings from Last 3 Encounters:  10/27/23 163 lb 3.2 oz (74 kg)  02/03/23 158 lb 6 oz (71.8 kg)  04/11/22 165 lb 2 oz (74.9 kg)   SpO2 Readings from Last 3 Encounters:  10/27/23 99%  02/03/23 99%  04/11/22 99%      Physical Exam Vitals and nursing note reviewed.  Constitutional:      Appearance: Normal appearance.  HENT:     Right Ear: Tympanic membrane, ear canal and external ear normal.     Left Ear: Tympanic membrane, ear canal and external ear normal.     Mouth/Throat:     Mouth: Mucous membranes are moist.     Pharynx: Oropharynx is clear.  Eyes:     Extraocular Movements: Extraocular movements intact.     Pupils: Pupils are equal, round, and reactive to light.  Cardiovascular:     Rate and Rhythm: Normal rate and regular  rhythm.     Pulses: Normal pulses.     Heart sounds: Normal heart sounds.  Pulmonary:     Effort: Pulmonary effort is normal.     Breath sounds: Normal breath sounds.  Abdominal:     General: Bowel sounds are normal. There is no distension.     Palpations: There is no mass.     Tenderness: There is no abdominal tenderness.     Hernia: No hernia is present.  Genitourinary:    Comments: deferred Musculoskeletal:     Right lower leg: No edema.     Left lower leg: No edema.  Lymphadenopathy:     Cervical: No cervical adenopathy.  Skin:    General: Skin is warm.  Neurological:     General: No focal deficit present.     Mental Status: He is alert.     Deep Tendon Reflexes:     Reflex Scores:      Bicep reflexes are 2+ on the right side and 2+ on the left side.      Patellar reflexes are 2+ on the right side and 2+ on the left side.     Comments: Bilateral upper and lower extremity strength 5/5  Psychiatric:        Mood and Affect: Mood normal.        Behavior: Behavior normal.        Thought Content: Thought content normal.        Judgment: Judgment normal.      Results for orders placed or performed in visit on 10/27/23  CBC  Result Value Ref Range   WBC 6.8 4.0 - 10.5 K/uL   RBC 4.44 4.22 - 5.81 Mil/uL   Platelets 279.0 150.0 - 400.0 K/uL   Hemoglobin 13.8 13.0 - 17.0 g/dL   HCT 14.7 82.9 - 56.2 %   MCV 93.3 78.0 - 100.0 fl   MCHC 33.3 30.0 - 36.0 g/dL   RDW 13.0 86.5 - 78.4 %  Comprehensive metabolic panel with GFR  Result Value Ref Range   Sodium 138 135 - 145 mEq/L   Potassium 4.2 3.5 - 5.1 mEq/L   Chloride 101 96 - 112 mEq/L   CO2 30 19 - 32 mEq/L   Glucose, Bld 95 70 - 99 mg/dL   BUN 13 6 - 23 mg/dL   Creatinine, Ser 6.96 0.40 - 1.50 mg/dL   Total Bilirubin 0.5 0.2 - 1.2 mg/dL   Alkaline Phosphatase 64 39 - 117 U/L   AST 18 0 - 37 U/L   ALT 16 0 - 53 U/L   Total Protein 7.1 6.0 - 8.3 g/dL   Albumin 4.8 3.5 - 5.2 g/dL   GFR 29.52 >84.13 mL/min   Calcium 9.6 8.4 - 10.5 mg/dL  Lipid panel  Result Value Ref Range   Cholesterol 178 0 - 200 mg/dL   Triglycerides 24.4 0.0 - 149.0 mg/dL   HDL 01.02 >72.53 mg/dL   VLDL 66.4 0.0 - 40.3 mg/dL   LDL Cholesterol 86 0 - 99 mg/dL   Total CHOL/HDL Ratio 2    NonHDL 96.01   TSH  Result Value Ref Range   TSH 2.52 0.35 - 5.50 uIU/mL  Urine Microscopic  Result Value Ref Range   WBC, UA none seen 0-2/hpf   RBC / HPF none seen 0-2/hpf      The ASCVD Risk score (Arnett DK, et al., 2019) failed to calculate for the following reasons:   The 2019 ASCVD risk score is  only valid for ages 83 to 6    Assessment & Plan:   Problem List Items Addressed This Visit       Nervous and Auditory   Chronic left-sided low back pain with left-sided sciatica   Patient currently maintained on gabapentin 6 mg 3 times daily.  Stable patient is getting relief with  medication continue. refill provided today      Relevant Medications   gabapentin (NEURONTIN) 600 MG tablet     Musculoskeletal and Integument   Rhus dermatitis   Seems to be drying up.  Will do triamcinolone 0.1% cream twice daily for 7 to 10 days.  If no improvement we will do steroid taper      Relevant Medications   triamcinolone cream (KENALOG) 0.1 %     Other   ADD (attention deficit disorder)   Patient currently managed by Loraine Leriche helper and on Vyvanse 70 mg daily.  Continue following specialist and taking medication as prescribed      Relevant Orders   TSH (Completed)   Preventative health care - Primary   Discussed age-appropriate immunizations and screening exams.  Did review patient's personal, surgical, social, family histories.  Patient up-to-date on all age-appropriate vaccinations he would like.  Patient is too young for CRC screening or prostate cancer screening.  Patient was information at discharge about preventative healthcare maintenance with anticipatory guidance      Relevant Orders   CBC (Completed)   Comprehensive metabolic panel with GFR (Completed)   TSH (Completed)   PND (post-nasal drip)   Refill of Flonase provided today.      Relevant Medications   fluticasone (FLONASE) 50 MCG/ACT nasal spray   COVID-19   Relevant Medications   fluticasone (FLONASE) 50 MCG/ACT nasal spray   Current smoker   Urine microscopy rule out microscopic hematuria      Relevant Orders   CBC (Completed)   Urine Microscopic (Completed)   Other Visit Diagnoses       Screening for lipid disorders       Relevant Orders   Lipid panel (Completed)       Return in about 1 year (around 10/26/2024) for CPE and Labs.    Audria Nine, NP

## 2023-10-27 NOTE — Assessment & Plan Note (Signed)
 Urine microscopy rule out microscopic hematuria

## 2023-10-27 NOTE — Assessment & Plan Note (Signed)
 Seems to be drying up.  Will do triamcinolone 0.1% cream twice daily for 7 to 10 days.  If no improvement we will do steroid taper

## 2023-10-27 NOTE — Telephone Encounter (Signed)
 Pt seen in office

## 2023-10-27 NOTE — Assessment & Plan Note (Signed)
 Discussed age-appropriate immunizations and screening exams.  Did review patient's personal, surgical, social, family histories.  Patient up-to-date on all age-appropriate vaccinations he would like.  Patient is too young for CRC screening or prostate cancer screening.  Patient was information at discharge about preventative healthcare maintenance with anticipatory guidance

## 2023-10-27 NOTE — Patient Instructions (Signed)
 Nice to see you today I will be in touch with the labs once I have them Follow up with me in 1 year, sooner if you need me  Make an appointment with Dr Patsy Lager about your shoulder and wrist

## 2023-10-27 NOTE — Assessment & Plan Note (Signed)
 Patient currently managed by [facility] helper and on Vyvanse 70 mg daily.  Continue following specialist and taking medication as prescribed

## 2023-10-27 NOTE — Assessment & Plan Note (Signed)
 Patient currently maintained on gabapentin 6 mg 3 times daily.  Stable patient is getting relief with medication continue. refill provided today

## 2023-10-28 ENCOUNTER — Encounter: Payer: Self-pay | Admitting: Nurse Practitioner

## 2023-11-20 ENCOUNTER — Other Ambulatory Visit: Payer: Self-pay

## 2023-11-20 ENCOUNTER — Other Ambulatory Visit (HOSPITAL_COMMUNITY): Payer: Self-pay

## 2023-11-20 MED ORDER — LISDEXAMFETAMINE DIMESYLATE 70 MG PO CAPS
70.0000 mg | ORAL_CAPSULE | Freq: Every day | ORAL | 0 refills | Status: DC
  Filled 2023-11-20: qty 30, 30d supply, fill #0

## 2023-11-21 ENCOUNTER — Other Ambulatory Visit (HOSPITAL_COMMUNITY): Payer: Self-pay

## 2023-12-03 ENCOUNTER — Encounter (HOSPITAL_COMMUNITY): Payer: Self-pay

## 2023-12-07 NOTE — Progress Notes (Deleted)
     Carrolyn Hilmes T. Kathey Simer, MD, CAQ [facility] at [facility] [address] [place], [address]  Phone: [phone]  FAX: [phone]  Darryl Crawford - 38 y.o. male  MRN [id number]  Date of Birth: 1986-04-10  Date: 12/10/2023  PCP: Dorothe Gaster, NP  Referral: Dorothe Gaster, NP  No chief complaint on file.  Subjective:   Darryl Crawford is a 39 y.o. very pleasant male patient with There is no height or weight on file to calculate BMI. who presents with the following:  Patient presents with ongoing issues with his shoulder and wrist.  Radiographs from June 2023 suggest ganglion cyst formation on the wrist.    Review of Systems is noted in the HPI, as appropriate  Objective:   There were no vitals taken for this visit.  GEN: No acute distress; alert,appropriate. PULM: Breathing comfortably in no respiratory distress PSYCH: Normally interactive.   Laboratory and Imaging Data:  Assessment and Plan:   ***

## 2023-12-10 ENCOUNTER — Ambulatory Visit: Admitting: Family Medicine

## 2023-12-17 ENCOUNTER — Other Ambulatory Visit (HOSPITAL_COMMUNITY): Payer: Self-pay

## 2023-12-17 MED ORDER — LISDEXAMFETAMINE DIMESYLATE 70 MG PO CAPS
70.0000 mg | ORAL_CAPSULE | Freq: Every day | ORAL | 0 refills | Status: DC
Start: 2023-12-17 — End: 2024-01-27
  Filled 2023-12-17 – 2023-12-18 (×2): qty 30, 30d supply, fill #0

## 2023-12-18 ENCOUNTER — Other Ambulatory Visit (HOSPITAL_COMMUNITY): Payer: Self-pay

## 2024-01-27 ENCOUNTER — Other Ambulatory Visit (HOSPITAL_COMMUNITY): Payer: Self-pay

## 2024-01-27 MED ORDER — LISDEXAMFETAMINE DIMESYLATE 70 MG PO CAPS
70.0000 mg | ORAL_CAPSULE | Freq: Every day | ORAL | 0 refills | Status: DC
  Filled 2024-01-27: qty 30, 30d supply, fill #0

## 2024-03-02 ENCOUNTER — Other Ambulatory Visit (HOSPITAL_COMMUNITY): Payer: Self-pay

## 2024-03-02 MED ORDER — LISDEXAMFETAMINE DIMESYLATE 70 MG PO CAPS
70.0000 mg | ORAL_CAPSULE | Freq: Every day | ORAL | 0 refills | Status: DC
  Filled 2024-03-02: qty 30, 30d supply, fill #0

## 2024-04-02 ENCOUNTER — Other Ambulatory Visit (HOSPITAL_COMMUNITY): Payer: Self-pay

## 2024-04-02 MED ORDER — LISDEXAMFETAMINE DIMESYLATE 70 MG PO CAPS
70.0000 mg | ORAL_CAPSULE | Freq: Every day | ORAL | 0 refills | Status: DC
  Filled 2024-04-02: qty 30, 30d supply, fill #0

## 2024-04-24 ENCOUNTER — Other Ambulatory Visit: Payer: Self-pay | Admitting: Nurse Practitioner

## 2024-04-24 DIAGNOSIS — G8929 Other chronic pain: Secondary | ICD-10-CM

## 2024-05-03 ENCOUNTER — Other Ambulatory Visit (HOSPITAL_COMMUNITY): Payer: Self-pay

## 2024-05-03 MED ORDER — LISDEXAMFETAMINE DIMESYLATE 70 MG PO CAPS
70.0000 mg | ORAL_CAPSULE | Freq: Every day | ORAL | 0 refills | Status: DC
  Filled 2024-05-03: qty 30, 30d supply, fill #0

## 2024-06-11 ENCOUNTER — Other Ambulatory Visit (HOSPITAL_COMMUNITY): Payer: Self-pay

## 2024-06-11 MED ORDER — LISDEXAMFETAMINE DIMESYLATE 70 MG PO CAPS
70.0000 mg | ORAL_CAPSULE | Freq: Every day | ORAL | 0 refills | Status: DC
  Filled 2024-06-11: qty 30, 30d supply, fill #0

## 2024-06-12 ENCOUNTER — Other Ambulatory Visit (HOSPITAL_COMMUNITY): Payer: Self-pay

## 2024-07-08 ENCOUNTER — Other Ambulatory Visit: Payer: Self-pay

## 2024-07-08 ENCOUNTER — Other Ambulatory Visit (HOSPITAL_COMMUNITY): Payer: Self-pay

## 2024-07-08 MED ORDER — LISDEXAMFETAMINE DIMESYLATE 70 MG PO CAPS
70.0000 mg | ORAL_CAPSULE | Freq: Every day | ORAL | 0 refills | Status: DC
  Filled 2024-07-08 – 2024-07-15 (×2): qty 30, 30d supply, fill #0

## 2024-07-15 ENCOUNTER — Other Ambulatory Visit (HOSPITAL_COMMUNITY): Payer: Self-pay

## 2024-08-17 ENCOUNTER — Other Ambulatory Visit (HOSPITAL_COMMUNITY): Payer: Self-pay

## 2024-08-17 MED ORDER — LISDEXAMFETAMINE DIMESYLATE 70 MG PO CAPS
70.0000 mg | ORAL_CAPSULE | Freq: Every day | ORAL | 0 refills | Status: AC
  Filled 2024-08-17: qty 30, 30d supply, fill #0

## 2024-10-27 ENCOUNTER — Encounter: Admitting: Nurse Practitioner
